# Patient Record
Sex: Female | Born: 1960 | Race: Asian | Hispanic: No | Marital: Married | Smoking: Former smoker
Health system: Southern US, Community
[De-identification: ages and names within clinical notes are randomized; demographics above are authoritative.]

## PROBLEM LIST (undated history)

## (undated) DIAGNOSIS — H7392 Unspecified disorder of tympanic membrane, left ear: Secondary | ICD-10-CM

## (undated) DIAGNOSIS — H7292 Unspecified perforation of tympanic membrane, left ear: Secondary | ICD-10-CM

## (undated) DIAGNOSIS — J029 Acute pharyngitis, unspecified: Secondary | ICD-10-CM

## (undated) HISTORY — DX: Acute pharyngitis, unspecified: J02.9

## (undated) HISTORY — DX: Unspecified disorder of tympanic membrane, left ear: H73.92

## (undated) HISTORY — PX: TONSILLECTOMY: SUR1361

## (undated) HISTORY — DX: Unspecified perforation of tympanic membrane, left ear: H72.92

---

## 2011-11-09 ENCOUNTER — Other Ambulatory Visit: Payer: Self-pay | Admitting: Family Medicine

## 2011-11-09 ENCOUNTER — Ambulatory Visit (INDEPENDENT_AMBULATORY_CARE_PROVIDER_SITE_OTHER): Payer: Self-pay | Admitting: Family Medicine

## 2011-11-09 ENCOUNTER — Encounter: Payer: Self-pay | Admitting: Family Medicine

## 2011-11-09 VITALS — BP 120/80 | Ht 60.0 in | Wt 104.0 lb

## 2011-11-09 DIAGNOSIS — R1310 Dysphagia, unspecified: Secondary | ICD-10-CM

## 2011-11-09 DIAGNOSIS — H7392 Unspecified disorder of tympanic membrane, left ear: Secondary | ICD-10-CM

## 2011-11-09 DIAGNOSIS — H7390 Unspecified disorder of tympanic membrane, unspecified ear: Secondary | ICD-10-CM

## 2011-11-09 HISTORY — DX: Unspecified disorder of tympanic membrane, left ear: H73.92

## 2011-11-09 MED ORDER — DEXLANSOPRAZOLE 30 MG PO CPDR: 30.0000 mg | DELAYED_RELEASE_CAPSULE | Freq: Every day | ORAL | Status: DC

## 2011-11-09 NOTE — Progress Notes (Signed)
CC: Elizabeth Pruitt is a 51 y.o. female is here for Establish Care and Sore Throat   Subjective: HPI:  Patient presents to establish care and to obtain a second opinion regarding one year of a persistent sore throat accompanied by a constellation of other upper respiratory symptoms including left ear fullness, nasal discharge, sinus pressure, pressure in her upper posterior pharynx, and lesions of the mouth. Her biggest complaint is a sore throat. She's been seen by multiple family physicians and has been to [facility] 2-[facility] times for workup of this. She describes her throat discomfort as a pain with swallowing in the back and lower aspect of her throat. There is no pain with movement of her neck. She denies choking or coordination difficulties we'll swallowing. The pain waxes and wanes on monthly basis but is always present and at its worse it is associated with a thick brown discharge that she's able to bring up by gruntin.  She admits to a sour taste in the back of her mouth when eating spicy foods but she takes over-the-counter antacids for, she's unable to quantify how often this occurs.    She's had a mild cough for the past year but there appears where he goes away completely for matter of weeks. She denies fevers,, nausea, change in voice, shortness of breath, orthopnea, wheezing, bloody nose, hemoptysis, swollen lymph nodes, unintentional weight loss.  Per her report treatments have included her take an oral antibiotic on a monthly basis for the past 12 months, symptoms improved for the first 3 days on antibiotic and then return almost predictably on the fourth or fifth day. She's taking numerous oral antihistamines, the names of which escapes her at the moment, but have never given her any relief. She has used numerous nasal steroid sprays in addition to nasal saline sprays without any improvement. She has had allergy testing and was getting allergy drops for the past months but has not had any improvement. She  has never had her nasal cavity or larynx visualized other than with a bedside otoscope.  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    Review Of Systems Outlined In HPI  History reviewed. No pertinent past medical history.   History reviewed. No pertinent family history.   History  Substance Use Topics  . Smoking status: Former Games developer  . Smokeless tobacco: Not on file  . Alcohol Use: No     Objective: Filed Vitals:   11/09/11 1603  BP: 120/80    General: Alert and Oriented, No Acute Distress HEENT: Pupils equal, round, reactive to light. Conjunctivae clear.  External ears unremarkable, canals clear with right tympanic membrane intact with appropriate landmarks, left tympanic membrane appears perforated and disfigured with poor landmarks and white pearly mass in the posterior superior quadrant.  Middle ear on right appears open without effusion. Pink inferior turbinates with moderate white mucoid discharge.  Moist mucous membranes, pharynx without inflammation however moderate cobblestoning but no oral lesions.  Neck supple without palpable lymphadenopathy nor abnormal masses. Thyroid not palpable Lungs: Clear to auscultation bilaterally, no wheezing/ronchi/rales.  Comfortable work of breathing. Good air movement. Cardiac: Regular rate and rhythm. Normal S1/S2.  No murmurs, rubs, nor gallops.   Mental Status: Mildly anxious Skin: Warm and dry.  Assessment & Plan: Elizabeth Pruitt was seen today for establish care and sore throat.  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 - Dexlansoprazole (DEXILANT) 30 MG capsule; Take 1 capsule (30 mg total) by mouth daily.    Patient unable to give specifics but does sound she's had an  appropriate treatment with antihistamines and nasal steroids however the frequency of her antibiotic use somewhat concerning. Her biggest concern is that she has a fungal infection or a cancerous process involving her throat. I believe she does have postnasal drip however she seems reluctant to accept this as a sole diagnosis. We discussed the benefit of a second opinion from a unit and throat group outside of her current group [facility] and perhaps laryngoscopy if felt warranted by her future ENT group may be of some benefit. In the referral of also asked for evaluation for the pearly white mass in the left middle ear suspicious for possible cholesteatoma. To rule out GERD as causing her sore throat I given her a two-week regimen of dexilant , i've asked her to return in 2 weeks to see if this has helped.  Over 30 minutes spent in face-to-face visit today of which at least 90% was counseling or coordinating care.   Return in about 2 days (around 11/11/2011).

## 2011-11-10 ENCOUNTER — Telehealth: Payer: Self-pay | Admitting: *Deleted

## 2011-11-10 NOTE — Telephone Encounter (Signed)
Pt states she gargled salt water last night for her throat and states her throat still hurts. She states she thinks she has a fungal infection and wants you to send Nystatin or something similar to her pharmacy until she gets in with ENT. Please advise.

## 2011-11-10 NOTE — Telephone Encounter (Signed)
Pt informed

## 2011-11-10 NOTE — Telephone Encounter (Addendum)
I didn't see anything that would suggest a fungal infection on my exam yesterday.  I still think the best plan at this time would be to manage this with the Dexilant reigmen and  symptomatically until she's seen by ENT for the second opinion.  This could include chloraseptic sprays/lozenges, ibuprofen 400-800mg  every 8 hours, and regular use of nasal steroid sprays that I believe she has from her prior provider.

## 2011-11-23 ENCOUNTER — Encounter: Payer: Self-pay | Admitting: Family Medicine

## 2011-11-23 DIAGNOSIS — J029 Acute pharyngitis, unspecified: Secondary | ICD-10-CM | POA: Insufficient documentation

## 2011-11-23 DIAGNOSIS — H7292 Unspecified perforation of tympanic membrane, left ear: Secondary | ICD-10-CM | POA: Insufficient documentation

## 2011-11-23 HISTORY — DX: Acute pharyngitis, unspecified: J02.9

## 2011-11-23 HISTORY — DX: Unspecified perforation of tympanic membrane, left ear: H72.92

## 2012-01-06 ENCOUNTER — Encounter: Payer: Self-pay | Admitting: Family Medicine

## 2012-01-06 ENCOUNTER — Ambulatory Visit (INDEPENDENT_AMBULATORY_CARE_PROVIDER_SITE_OTHER): Payer: Managed Care, Other (non HMO) | Admitting: Family Medicine

## 2012-01-06 ENCOUNTER — Ambulatory Visit (INDEPENDENT_AMBULATORY_CARE_PROVIDER_SITE_OTHER): Payer: Managed Care, Other (non HMO)

## 2012-01-06 VITALS — BP 128/90 | HR 80 | Ht 60.0 in | Wt 106.0 lb

## 2012-01-06 DIAGNOSIS — M542 Cervicalgia: Secondary | ICD-10-CM

## 2012-01-06 DIAGNOSIS — M62838 Other muscle spasm: Secondary | ICD-10-CM

## 2012-01-06 DIAGNOSIS — R209 Unspecified disturbances of skin sensation: Secondary | ICD-10-CM

## 2012-01-06 DIAGNOSIS — R200 Anesthesia of skin: Secondary | ICD-10-CM

## 2012-01-06 DIAGNOSIS — J029 Acute pharyngitis, unspecified: Secondary | ICD-10-CM

## 2012-01-06 MED ORDER — ALPRAZOLAM 0.5 MG PO TABS: 0.5000 mg | ORAL_TABLET | Freq: Two times a day (BID) | ORAL | Status: DC | PRN

## 2012-01-06 MED ORDER — HYDROXYZINE HCL 25 MG PO TABS: 25.0000 mg | ORAL_TABLET | Freq: Every evening | ORAL | Status: DC | PRN

## 2012-01-06 NOTE — Progress Notes (Signed)
CC: Elizabeth Pruitt is a 51 y.o. female is here for neck spasms   Subjective: HPI:  Patient presents with a new concern of neck pain. She describes it as a tightness and a burning in the upper posterior neck that radiates somewhat towards the shoulders. It is worse when pillows or mattress as her touching the back of the neck. It is improved with chiropractic manipulation but only for matter of hours. It is somewhat improved with heat. Muscle relaxers in the past have not been helpful as she had identical discomfort 3 years ago which resolved  after months of physical therapy. Xanax helps the pain tremendously, hydroxyzine helps as well with sleep. Unlike 3 years ago she is also experiencing numbness and weakness in the bilateral hands in the fifth and fourth digit. This numbness and weakness is present all hours of the day but seems to be worse when sleeping. She denies recent trauma, coordination problems, nor motor or sensory disturbances other than above  Patient was seen by ear nose and throat and given Bactroban washes for this. She reports using this for couple days but became to be a hassle so she stopped. She still reports pain with swallowing on the right side that comes and goes. She has days where she is completely pain-free. She has "heavy"nasal discharge which is clear on most days of the week. She's decided that she's not to go back to Dr. Shona Simpson with ENT because she believes it's a hassle. She denies cough, choking, trouble swallowing, facial pain, hearing loss, fevers, chills.  Review Of Systems Outlined In HPI  Past Medical History  Diagnosis Date  . 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 Sore Throat - Dr. Dirk Dress ENT 11/23/2011     No family history on file.   History  Substance Use Topics  . Smoking status: Former Games developer  . Smokeless tobacco: Not on file  . Alcohol Use: No     Objective: Filed  Vitals:   01/06/12 0955  BP: 128/90  Pulse: 80    General: Alert and Oriented, No Acute Distress HEENT: Pupils equal, round, reactive to light. Conjunctivae clear.   Moist mucous membranes, pharynx without inflammation nor lesions.  Neck supple without palpable lymphadenopathy nor abnormal masses. Lungs: Clear to auscultation bilaterally, no wheezing/ronchi/rales.  Comfortable work of breathing. Good air movement. Cardiac: Regular rate and rhythm. Normal S1/S2.  No murmurs, rubs, nor gallops.   Extremities: No peripheral edema.  Strong peripheral pulses. C5-C6 DTRs two over four bilaterally. Normal grip strength, normal wrist flexion and extension, normal elbow flexion and extension. No sign of muscle atrophy in the hands. No swollen or redness of the joints in the hands. Decreased sensation to light touch in the fifth digit bilaterally on the palmar and dorsal aspects. Neck: Midline tenderness of the C6 with palpation, hypertonic superior trapezius bilaterally with reproduction of her discomfort with palpation of the paraspinal posterior cervical musculature Spurling's negative Mental Status: No depression, anxiety, nor agitation. Skin: Warm and dry.  Assessment & Plan: Elizabeth Pruitt was seen today for neck spasms.  Diagnoses and associated orders for this visit:  Neck pain - Nerve conduction test; Future - DG Cervical Spine Complete; Future  Bilateral hand numbness - Nerve conduction test; Future  Muscle spasm - ALPRAZolam (XANAX) 0.5 MG tablet; Take 1 tablet (0.5 mg total) by mouth 2 (two) times daily as needed.  Sore throat - dr. Olegario Messier [facility] ent  Other Orders - Discontinue: HYDROXYZINE HCL PO; Take by mouth. - hydrOXYzine (ATARAX/VISTARIL) 25 MG tablet; Take 1 tablet (25 mg total) by mouth at bedtime as needed.    Sore throat: Unchanged I advised her to followup with her ENT as last progress note mentioned a CT scan of the sinuses would be the next if not improving. Neck pain: I  believe there is a component of muscle spasms  contributing to her pain, it sounds as muscle relaxers been ineffective in the past, she may continue Xanax on an as-needed basis. Cervical films to look for arthritis or bone spurring. I did nerve conduction studies due to her subjective sensory loss. Patient was given handout on range of motion in cervical musculature stretching techniques.   Return in about 2 weeks (around 01/20/2012).

## 2012-03-09 ENCOUNTER — Ambulatory Visit: Payer: Managed Care, Other (non HMO) | Admitting: Family Medicine

## 2012-03-23 ENCOUNTER — Ambulatory Visit (INDEPENDENT_AMBULATORY_CARE_PROVIDER_SITE_OTHER): Payer: Managed Care, Other (non HMO) | Admitting: Family Medicine

## 2012-03-23 ENCOUNTER — Encounter: Payer: Self-pay | Admitting: Family Medicine

## 2012-03-23 VITALS — BP 123/82 | HR 71 | Wt 111.0 lb

## 2012-03-23 DIAGNOSIS — A499 Bacterial infection, unspecified: Secondary | ICD-10-CM

## 2012-03-23 DIAGNOSIS — J329 Chronic sinusitis, unspecified: Secondary | ICD-10-CM

## 2012-03-23 MED ORDER — PREDNISONE 20 MG PO TABS: ORAL_TABLET | ORAL | Status: AC

## 2012-03-23 MED ORDER — AMOXICILLIN-POT CLAVULANATE 500-125 MG PO TABS: ORAL_TABLET | ORAL | Status: AC

## 2012-03-23 NOTE — Progress Notes (Signed)
CC: Elizabeth Pruitt is a 51 y.o. female is here for Sinusitis   Subjective: HPI:  Patient complains of facial pressure and nasal congestion. This is been present for about a month and has worsening on a weekly basis. Pressure is described as pain and moderate to severe in severity which is worsened with leaning forward or lying down. Nasal congestion is green and present 24 hours a day but does not interfere with sleep. Symptoms have slightly improved with nasal saline, Mucinex, Aleve. She has fatigue. She denies fevers, chills, motor sensory disturbances, double vision, ocular complaints, ear complaints, headache other than that described above. She denies neck pain, dysphagia, hearing loss, rashes, shortness of breath, nor chest pain.   Review Of Systems Outlined In HPI  Past Medical History  Diagnosis Date  . 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 Sore Throat - Dr. Dirk Dress ENT 11/23/2011     No family history on file.   History  Substance Use Topics  . Smoking status: Former Games developer  . Smokeless tobacco: Not on file  . Alcohol Use: No     Objective: Filed Vitals:   03/23/12 1316  BP: 123/82  Pulse: 71    General: Alert and Oriented, No Acute Distress HEENT: Pupils equal, round, reactive to light. Conjunctivae clear.  External ears unremarkable, canals clear with intact TM on the right but a chronic perforated TM on the left..  Middle ear appears open without effusion. Boggy and erythematous inferior turbinates with moderate mucoid discharge.  Moist mucous membranes, pharynx without inflammation nor lesions.  Neck supple without palpable lymphadenopathy nor abnormal masses. Frontal sinus tenderness to percussion Lungs: Clear to auscultation bilaterally, no wheezing/ronchi/rales.  Comfortable work of breathing. Good air movement. Cardiac: Regular rate and rhythm. Normal S1/S2.  No murmurs, rubs, nor gallops.    Extremities: No peripheral edema.  Strong peripheral pulses.  Mental Status: No depression, anxiety, nor agitation. Skin: Warm and dry.  Assessment & Plan: Elizabeth Pruitt was seen today for sinusitis.  Diagnoses and associated orders for this visit:  Bacterial sinusitis - amoxicillin-clavulanate (AUGMENTIN) 500-125 MG per tablet; Take one by mouth every 8 hours for ten total days. - predniSONE (DELTASONE) 20 MG tablet; Two tabs at once daily for five days.    Start Augmentin, continue nasal saline washes, Mucinex. Given severity of discomfort will start prednisone moderate dose for 5 days.Signs and symptoms requring emergent/urgent reevaluation were discussed with the patient.  Return if symptoms worsen or fail to improve.

## 2013-01-01 ENCOUNTER — Encounter: Payer: Self-pay | Admitting: Emergency Medicine

## 2013-01-01 ENCOUNTER — Emergency Department
Admission: EM | Admit: 2013-01-01 | Discharge: 2013-01-01 | Disposition: A | Discharge status: Home / Self Care | Payer: Managed Care, Other (non HMO) | Source: Home / Self Care | Attending: Emergency Medicine | Admitting: Emergency Medicine

## 2013-01-01 DIAGNOSIS — R300 Dysuria: Secondary | ICD-10-CM

## 2013-01-01 DIAGNOSIS — N390 Urinary tract infection, site not specified: Secondary | ICD-10-CM

## 2013-01-01 DIAGNOSIS — J209 Acute bronchitis, unspecified: Secondary | ICD-10-CM

## 2013-01-01 LAB — POCT URINALYSIS DIP (MANUAL ENTRY)
Bilirubin, UA: NEGATIVE
Glucose, UA: NEGATIVE
Ketones, POC UA: NEGATIVE
Nitrite, UA: POSITIVE
Spec Grav, UA: 1.015 (ref 1.005–1.03)
Urobilinogen, UA: 0.2 (ref 0–1)
pH, UA: 7 (ref 5–8)

## 2013-01-01 MED ORDER — PROMETHAZINE-CODEINE 6.25-10 MG/5ML PO SYRP: ORAL_SOLUTION | ORAL | Status: DC

## 2013-01-01 MED ORDER — LEVOFLOXACIN 500 MG PO TABS: ORAL_TABLET | ORAL | Status: DC

## 2013-01-01 NOTE — ED Notes (Signed)
Elizabeth Pruitt c/o dysuria and hematuria x 2 days. Taken Azo today. Productive cough and congestion with CP x 4 days. C/o chills, denies fever.

## 2013-01-01 NOTE — ED Provider Notes (Addendum)
CSN: [id number]     Arrival date & time 01/01/13  1558 History   First MD Initiated Contact with Patient 01/01/13 1601     Chief Complaint  Patient presents with  . Dysuria  . Cough   (Consider location/radiation/quality/duration/timing/severity/associated sxs/prior Treatment) HPI URI HISTORY  Elizabeth Pruitt is a 52 y.o. female who complains of onset of cold symptoms for 4 days.  Have been using over-the-counter treatment which helps a little bit.  No chills/sweats +  Low-grade Fever  +  Nasal congestion +  Discolored Post-nasal drainage No sinus pain/pressure No sore throat  +  Cough, especially at night No wheezing Positive, mild chest congestion No hemoptysis No shortness of breath No pleuritic pain  No itchy/red eyes No earache  No nausea No vomiting No abdominal pain No diarrhea  No skin rashes +  Fatigue No myalgias No headache  UTI HISTORY  This is a 52 y.o. female who presents today with UTI symptoms for 1 day.  + dysuria + frequency + urgency No hematuria No vaginal discharge No fever/chills No lower abdominal pain No nausea No vomiting No back pain Positive fatigue She denies chance of pregnancy. Has tried over-the-counter measures without improvement.   Past Medical History  Diagnosis Date  . 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 Sore Throat - Dr. Dirk Dress ENT 11/23/2011   Past Surgical History  Procedure Laterality Date  . Cesarean section     History reviewed. No pertinent family history. History  Substance Use Topics  . Smoking status: Former Games developer  . Smokeless tobacco: Never Used  . Alcohol Use: No   OB History   Grav Para Term Preterm Abortions TAB SAB Ect Mult Living                 Review of Systems  All other systems reviewed and are negative.    Allergies  Sulfa drugs cross reactors  Home Medications   Current Outpatient Rx  Name  Route  Sig   Dispense  Refill  . levofloxacin (LEVAQUIN) 500 MG tablet      Take 1 tablet daily for 10 days.--- This is an antibiotic for both bronchitis and urinary tract infection   10 tablet   0   . promethazine-codeine (PHENERGAN WITH CODEINE) 6.25-10 MG/5ML syrup      Take 1-2 teaspoons at bedtime as needed for severe cough. May cause drowsiness.   120 mL   0    BP 142/94  Pulse 98  Temp(Src) 98.4 F (36.9 C) (Oral)  Resp 14  Ht 5' (1.524 m)  Wt 113 lb (51.256 kg)  BMI 22.07 kg/m2  SpO2 97% Physical Exam  Nursing note and vitals reviewed. Constitutional: She is oriented to person, place, and time. She appears well-developed and well-nourished. No distress.  HENT:  Head: Normocephalic and atraumatic.  Right Ear: Tympanic membrane normal.  Left Ear: Tympanic membrane normal.  Nose: Mucosal edema and rhinorrhea (discolored) present. Right sinus exhibits maxillary sinus tenderness. Right sinus exhibits no frontal sinus tenderness. Left sinus exhibits maxillary sinus tenderness. Left sinus exhibits no frontal sinus tenderness.  Mouth/Throat: Oropharynx is clear and moist. No oropharyngeal exudate.  Eyes: Right eye exhibits no discharge. Left eye exhibits no discharge. No scleral icterus.  Neck: Neck supple.  Cardiovascular: Normal rate, regular rhythm and normal heart sounds.   Pulmonary/Chest: No respiratory distress. She has no wheezes. She has rhonchi. She has no rales.  Abdominal: Soft. She exhibits no distension and no mass. There is tenderness (minimal, suprapubic). There is no rebound and no guarding.  No flank or CVA tenderness  Lymphadenopathy:    She has no cervical adenopathy.  Neurological: She is alert and oriented to person, place, and time.  Skin: Skin is warm and dry.    ED Course  Procedures (including critical care time) Labs Review Labs Reviewed  URINE CULTURE  POCT URINALYSIS DIP (MANUAL ENTRY)   Imaging Review No results found.  EKG Interpretation     Date/Time:    Ventricular Rate:    PR Interval:    QRS Duration:   QT Interval:    QTC Calculation:   R Axis:     Text Interpretation:             Urinalysis: Positive for blood, leukocytes and nitrate MDM   1. Acute bronchitis   2. Dysuria   3. Infection of urinary tract    We discussed treatment options. After risks, benefits, alternatives discussed, she agrees with the following plans: Levaquin 500 mg daily x10 days. This will cover the acute maxillary sinusitis and acute bronchitis.--This will also cover her UTI Urine culture sent Small prescription for Phenergan with codeine cough syrup when necessary cough at nighttime Other symptomatic care discussed Followup with PCP one week, sooner if worse or new symptoms Precautions discussed. Red flags discussed. Questions invited and answered. Patient voiced understanding and agreement.    Elizabeth Manes, MD 01/01/13 1747  Elizabeth Manes, MD 01/01/13 [phone]

## 2013-01-02 LAB — URINE CULTURE

## 2013-07-23 ENCOUNTER — Encounter: Payer: Self-pay | Admitting: Emergency Medicine

## 2013-07-23 ENCOUNTER — Emergency Department
Admission: EM | Admit: 2013-07-23 | Discharge: 2013-07-23 | Disposition: A | Discharge status: Home / Self Care | Payer: Managed Care, Other (non HMO) | Source: Home / Self Care | Attending: Emergency Medicine | Admitting: Emergency Medicine

## 2013-07-23 DIAGNOSIS — H6692 Otitis media, unspecified, left ear: Secondary | ICD-10-CM

## 2013-07-23 DIAGNOSIS — H6690 Otitis media, unspecified, unspecified ear: Secondary | ICD-10-CM

## 2013-07-23 MED ORDER — AMOXICILLIN-POT CLAVULANATE 875-125 MG PO TABS: 1.0000 | ORAL_TABLET | Freq: Two times a day (BID) | ORAL | Status: DC

## 2013-07-23 NOTE — ED Notes (Signed)
  Cold sx started approx 2 1/2 weeks ago.  Now has PND  With pain in ears L>R.

## 2013-07-23 NOTE — ED Provider Notes (Signed)
CSN: [id number]     Arrival date & time 07/23/13  1534 History   First MD Initiated Contact with Patient 07/23/13 1611     Chief Complaint  Patient presents with  . Otalgia   (Consider location/radiation/quality/duration/timing/severity/associated sxs/prior Treatment) Patient is a 53 y.o. female presenting with ear pain. The history is provided by the patient.  Otalgia  Cold sx started approx 2 1/2 weeks ago. Now has PND With pain in ears L>R.  URI HISTORY  Elizabeth Pruitt is a 53 y.o. female who complains of onset of cold symptoms for several days.  Have been using over-the-counter treatment which helps a little bit.  No chills/sweats +  Fever  +  Nasal congestion +  Discolored Post-nasal drainage + L sinus pain/pressure + mild sore throat  No cough No wheezing No chest congestion No hemoptysis No shortness of breath No pleuritic pain  No itchy/red eyes + L earache  No nausea No vomiting No abdominal pain No diarrhea  No skin rashes +  Fatigue No myalgias No headache    Prior history of ear infections in the past. Status post tonsillectomy in the past Past Medical History  Diagnosis Date  . 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 Sore Throat - Dr. Dirk DressKathy Teasdall ENT 11/23/2011   Past Surgical History  Procedure Laterality Date  . Cesarean section    . Tonsillectomy     History reviewed. No pertinent family history. History  Substance Use Topics  . Smoking status: Former Games developermoker  . Smokeless tobacco: Never Used  . Alcohol Use: No   OB History   Grav Para Term Preterm Abortions TAB SAB Ect Mult Living                 Review of Systems  HENT: Positive for ear pain.   All other systems reviewed and are negative.   Allergies  Sulfa antibiotics and Sulfa drugs cross reactors  Home Medications   Prior to Admission medications   Medication Sig Start Date End Date Taking? Authorizing Provider   amoxicillin-clavulanate (AUGMENTIN) 875-125 MG per tablet Take 1 tablet by mouth 2 (two) times daily. For 10 days. Take with food. 07/23/13   Lajean Manesavid Massey, MD   BP 118/83  Pulse 81  Temp(Src) 97.7 F (36.5 C) (Oral)  Ht 5' (1.524 m)  Wt 106 lb 6.4 oz (48.263 kg)  BMI 20.78 kg/m2  SpO2 98% Physical Exam  Nursing note and vitals reviewed. Constitutional: She is oriented to person, place, and time. She appears well-developed and well-nourished. No distress.  HENT:  Head: Normocephalic and atraumatic.  Right Ear: Tympanic membrane, external ear and ear canal normal.  Left Ear: External ear and ear canal normal. Tympanic membrane is injected and erythematous.  Nose: Mucosal edema and rhinorrhea present.  Mouth/Throat: Oropharynx is clear and moist. No oral lesions.  Eyes: Conjunctivae are normal. No scleral icterus.  Neck: Neck supple.  Cardiovascular: Normal rate, regular rhythm and normal heart sounds.   Pulmonary/Chest: Effort normal and breath sounds normal.  Lymphadenopathy:    She has no cervical adenopathy.  Neurological: She is alert and oriented to person, place, and time.  Skin: Skin is warm and dry.    ED Course  Procedures (including critical care time) Labs Review Labs Reviewed - No data to display  Imaging Review No results found.   MDM   1. Acute left otitis media    Treatment options discussed,  as well as risks, benefits, alternatives. Patient voiced understanding and agreement with the following plans:  Augmentin 875 twice a day x10 days Mucinex D. twice a day Push fluids and other symptomatic care discussed. Follow-up with your primary care doctor in 5-7 days if not improving, or sooner if symptoms become worse. Precautions discussed. Red flags discussed. Questions invited and answered. Patient voiced understanding and agreement.     Lajean Manes, MD 07/23/13 [phone]

## 2013-07-24 ENCOUNTER — Ambulatory Visit: Payer: Managed Care, Other (non HMO) | Admitting: Family Medicine

## 2013-07-26 ENCOUNTER — Encounter: Payer: Self-pay | Admitting: Family Medicine

## 2013-07-26 ENCOUNTER — Ambulatory Visit (INDEPENDENT_AMBULATORY_CARE_PROVIDER_SITE_OTHER): Payer: Managed Care, Other (non HMO) | Admitting: Family Medicine

## 2013-07-26 VITALS — BP 127/89 | HR 90 | Wt 109.0 lb

## 2013-07-26 DIAGNOSIS — R5383 Other fatigue: Secondary | ICD-10-CM

## 2013-07-26 DIAGNOSIS — R5381 Other malaise: Secondary | ICD-10-CM

## 2013-07-26 DIAGNOSIS — L820 Inflamed seborrheic keratosis: Secondary | ICD-10-CM

## 2013-07-26 DIAGNOSIS — R232 Flushing: Secondary | ICD-10-CM

## 2013-07-26 NOTE — Addendum Note (Signed)
Addended by: Laren BoomHOMMEL, [place] on: 07/26/2013 12:07 PM   Modules accepted: Level of Service

## 2013-07-26 NOTE — Progress Notes (Signed)
CC: Elizabeth Pruitt is a 53 y.o. female is here for pt is having chills and freeze SK   Subjective: HPI:  Patient complains of severe flushing, chills, feelings of overwhelming warmth that fluctuates throughout the day and worsened by sudden changes in temperature. Symptoms have been present for 2-3 years and a source of her issues has not been found. She tells me she has been checked for abnormal thyroid function over a year ago and this was "mildly abnormal"she's unsure of specifics beyond that.  No interventions other than above. She forgets the date of her last menstrual period it has been well over a year since she's had any vaginal bleeding. She's uncertain whether or not symptoms began around the time of menopause.  Denies trouble fighting infections, swollen lymph nodes, unintentional weight loss, gastrointestinal disturbance, nor muscle or joint pain. Review of systems is positive for fatigue is also present on a daily basis and worse when the above symptoms are at their worst. Nothing particularly makes it better.  She complains of a growth on her left face overlying the left cheek that has been present for years that is slowly growing. She describes daily pain that is worse after applying makeup or when washing her face. Pain has been worsening on yearly basis now mild to moderate severity. No interventions as of yet she denies pigmented lesions elsewhere, personal or family history of skin cancer.   Review Of Systems Outlined In HPI  Past Medical History  Diagnosis Date  . 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 Sore Throat - Dr. Dirk DressKathy Teasdall ENT 11/23/2011    Past Surgical History  Procedure Laterality Date  . Cesarean section    . Tonsillectomy     No family history on file.  History   Social History  . Marital Status: Married    Spouse Name: N/A    Number of Children: N/A  . Years of Education: N/A    Occupational History  . Not on file.   Social History Main Topics  . Smoking status: Former Games developermoker  . Smokeless tobacco: Never Used  . Alcohol Use: No  . Drug Use: No  . Sexual Activity: Yes   Other Topics Concern  . Not on file   Social History Narrative  . No narrative on file     Objective: BP 127/89  Pulse 90  Wt 109 lb (49.442 kg)  General: Alert and Oriented, No Acute Distress HEENT: Pupils equal, round, reactive to light. Conjunctivae clear.  Moist membranes pharynx unremarkable. Neck is without any appreciable thyromegaly Lungs: Clear to auscultation bilaterally, no wheezing/ronchi/rales.  Comfortable work of breathing. Good air movement. Cardiac: Regular rate and rhythm. Normal S1/S2.  No murmurs, rubs, nor gallops.   Extremities: No peripheral edema.  Strong peripheral pulses.  Mental Status: No depression, anxiety, nor agitation. Skin: Warm and dry. On her face overlying the left cheek there is a 3 mm diameter seborrheic keratosis which is tender to the touch  Assessment & Plan: Elizabeth Pruitt was seen today for pt is having chills and freeze sk.  Diagnoses and associated orders for this visit:  Vasomotor flushing - TSH - T4, free - T3, free - FSH  Fatigue - TSH - T4, free - T3, free - CBC - Vit D  25 hydroxy (rtn osteoporosis monitoring)  Seborrheic keratoses, inflamed    Vasomotor flushing with fatigue: Rule out thyroid abnormality, my high suspicion is that  this is due to postmenopausal vasomotor flushing. Thyroid studies are normal will offer hormone replacement therapy versus SSRI/SNRI therapy. Fatigue: Rule out anemia, hypothyroidism, vitamin D deficiency Seborrheic keratosis: She would prefer to have this lesion destructed due to persistent pain which was performed with cryotherapy today Cryotherapy Procedure Note  Pre-operative Diagnosis: Inflamed seborrheic keratosis  Post-operative Diagnosis: Same  Locations: Left cheek of the  face  Indications: Persistent pain  Anesthesia: None  Procedure Details  History of allergy to iodine: no. Pacemaker? no.  Patient informed of risks (permanent scarring, infection, light or dark discoloration, bleeding, infection, weakness, numbness and recurrence of the lesion) and benefits of the procedure and verbal informed consent obtained.  The areas are treated with liquid nitrogen therapy, frozen until ice ball extended 2 mm beyond lesion, allowed to thaw, and treated again. The patient tolerated procedure well.  The patient was instructed on post-op care, warned that there may be blister formation, redness and pain. Recommend OTC analgesia as needed for pain.  Condition: Stable  Complications: none.  Plan: 1. Instructed to keep the area dry and covered for 24-48h and clean thereafter. 2. Warning signs of infection were reviewed.   3. Recommended that the patient use OTC analgesics as needed for pain.  4. Return in 1 week if lesion is not scabbing and beginning to fall off.    Return if symptoms worsen or fail to improve.

## 2013-07-27 LAB — CBC
HCT: 38.9 % (ref 36.0–46.0)
Hemoglobin: 12.9 g/dL (ref 12.0–15.0)
MCH: 30.2 pg (ref 26.0–34.0)
MCHC: 33.2 g/dL (ref 30.0–36.0)
MCV: 91.1 fL (ref 78.0–100.0)
Platelets: 251 K/uL (ref 150–400)
RBC: 4.27 MIL/uL (ref 3.87–5.11)
RDW: 13.2 % (ref 11.5–15.5)
WBC: 4.8 K/uL (ref 4.0–10.5)

## 2013-07-27 LAB — T3, FREE: T3, Free: 3.2 pg/mL (ref 2.3–4.2)

## 2013-07-27 LAB — VITAMIN D 25 HYDROXY (VIT D DEFICIENCY, FRACTURES): Vit D, 25-Hydroxy: 39 ng/mL (ref 30–89)

## 2013-07-27 LAB — FOLLICLE STIMULATING HORMONE: FSH: 146.5 m[IU]/mL — AB

## 2013-07-27 LAB — TSH: TSH: 1.673 u[IU]/mL (ref 0.350–4.500)

## 2013-07-27 LAB — T4, FREE: Free T4: 1.39 ng/dL (ref 0.80–1.80)

## 2013-07-30 ENCOUNTER — Telehealth: Payer: Self-pay | Admitting: Family Medicine

## 2013-07-30 DIAGNOSIS — R232 Flushing: Secondary | ICD-10-CM | POA: Insufficient documentation

## 2013-07-30 MED ORDER — CONJ ESTROG-MEDROXYPROGEST ACE 0.3-1.5 MG PO TABS: 1.0000 | ORAL_TABLET | Freq: Every day | ORAL | Status: DC

## 2013-07-30 NOTE — Telephone Encounter (Signed)
Pt.notified

## 2013-07-30 NOTE — Telephone Encounter (Signed)
Hormone replacement therapy sent to [facility] on file.  F/U with me in the next 2-4 weeks or sooner for any concerns.

## 2013-08-09 ENCOUNTER — Encounter: Payer: Self-pay | Admitting: Family Medicine

## 2013-08-09 ENCOUNTER — Ambulatory Visit (INDEPENDENT_AMBULATORY_CARE_PROVIDER_SITE_OTHER): Payer: Managed Care, Other (non HMO) | Admitting: Family Medicine

## 2013-08-09 VITALS — BP 115/88 | HR 65 | Wt 106.0 lb

## 2013-08-09 DIAGNOSIS — R232 Flushing: Secondary | ICD-10-CM

## 2013-08-09 DIAGNOSIS — R55 Syncope and collapse: Secondary | ICD-10-CM

## 2013-08-09 DIAGNOSIS — L820 Inflamed seborrheic keratosis: Secondary | ICD-10-CM

## 2013-08-09 MED ORDER — VENLAFAXINE HCL ER 75 MG PO CP24: 75.0000 mg | ORAL_CAPSULE | Freq: Every day | ORAL | Status: DC

## 2013-08-09 NOTE — Progress Notes (Signed)
CC: Elizabeth Pruitt is a 53 y.o. female is here for Seborrheic Keratosis   Subjective: HPI:  Presents for followup of vasomotor flushing. She states that symptoms improved approximately 50% while on Prempro. Unfortunately within days after starting this she had a swelling sensation in her hands, nighttime nervousness, both of which were severe in severity and intolerable. She cut her dose in half about 2 weeks ago and initial symptoms remain improved, side effects became moderate in severity over still intolerable. She stopped her medication last week and since then has had complete resolution of the side effects but return of severe hot flashes and chills. Symptoms are chronic daily basis worsened by 7 temperature swings. She denies unintentional weight loss or gain, objective fever, cough, shortness of breath, abdominal pain.  Reports that the seborrheic keratosis that was treated with cryotherapy last month scabbed over and has reduced in size by about 50%. It is still painful to the touch similar to symptoms that were prior to prior therapy however barely improved   Review Of Systems Outlined In HPI  Past Medical History  Diagnosis Date  . 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 Sore Throat - Dr. Dirk DressKathy Teasdall ENT 11/23/2011    Past Surgical History  Procedure Laterality Date  . Cesarean section    . Tonsillectomy     No family history on file.  History   Social History  . Marital Status: Married    Spouse Name: N/A    Number of Children: N/A  . Years of Education: N/A   Occupational History  . Not on file.   Social History Main Topics  . Smoking status: Former Games developermoker  . Smokeless tobacco: Never Used  . Alcohol Use: No  . Drug Use: No  . Sexual Activity: Yes   Other Topics Concern  . Not on file   Social History Narrative  . No narrative on file     Objective: BP 115/88  Pulse 65  Wt 106 lb (48.081  kg)  General: Alert and Oriented, No Acute Distress HEENT: Pupils equal, round, reactive to light. Conjunctivae clear.  Moist membranes pharynx unremarkable Lungs: Clear to auscultation bilaterally, no wheezing/ronchi/rales.  Comfortable work of breathing. Good air movement. Cardiac: Regular rate and rhythm. Normal S1/S2.  No murmurs, rubs, nor gallops.   Extremities: No peripheral edema.  Strong peripheral pulses.  Mental Status: No depression, anxiety, nor agitation. Skin: Warm and dry. 2 mm diameter inflamed and tender seborrheic keratosis on the left cheek below the left eye  Assessment & Plan: Elizabeth Pruitt was seen today for seborrheic keratosis.  Diagnoses and associated orders for this visit:  Vasomotor instability - venlafaxine XR (EFFEXOR XR) 75 MG 24 hr capsule; Take 1 capsule (75 mg total) by mouth daily.  Vasomotor flushing  Seborrheic keratoses, inflamed    Vasomotor instability: Uncontrolled stop estrogen start low-dose Effexor. Seborrheic keratosis: Repeat cryotherapy was performed, she was not billed for this procedure given how recent the initial procedure was done  Cryotherapy Procedure Note  Pre-operative Diagnosis: inflammed SK  Post-operative Diagnosis: same  Locations: left facial cheek  Indications: pain  Anesthesia: not required none  Procedure Details  History of allergy to iodine: no. Pacemaker? no.  Patient informed of risks (permanent scarring, infection, light or dark discoloration, bleeding, infection, weakness, numbness and recurrence of the lesion) and benefits of the procedure and verbal informed consent obtained.  The areas are treated with liquid  nitrogen therapy, frozen until ice ball extended 2 mm beyond lesion, allowed to thaw, and treated again. The patient tolerated procedure well.  The patient was instructed on post-op care, warned that there may be blister formation, redness and pain. Recommend OTC analgesia as needed for  pain.  Condition: Stable  Complications: none.  Plan: 1. Instructed to keep the area dry and covered for 24-48h and clean thereafter. 2. Warning signs of infection were reviewed.   3. Recommended that the patient use OTC analgesics as needed for pain.  4. Return PRN   Return if symptoms worsen or fail to improve.

## 2014-07-30 ENCOUNTER — Telehealth: Payer: Self-pay | Admitting: *Deleted

## 2014-07-30 NOTE — Telephone Encounter (Signed)
Jocelyn, pt's daughter called and states her mother is feeling worse and wanted to discuss her concerns.Marland KitchenMarland KitchenI called the daughter and left a message on vm that it would be best for pt to schedule an appt since I am not sure what sxs she is having and she has not been in about a year.An appt would be the best way to discuss any concerns

## 2014-11-24 ENCOUNTER — Ambulatory Visit (INDEPENDENT_AMBULATORY_CARE_PROVIDER_SITE_OTHER): Payer: Managed Care, Other (non HMO)

## 2014-11-24 ENCOUNTER — Encounter: Payer: Self-pay | Admitting: Family Medicine

## 2014-11-24 ENCOUNTER — Ambulatory Visit (INDEPENDENT_AMBULATORY_CARE_PROVIDER_SITE_OTHER): Payer: Managed Care, Other (non HMO) | Admitting: Family Medicine

## 2014-11-24 VITALS — BP 127/89 | HR 79 | Wt 109.0 lb

## 2014-11-24 DIAGNOSIS — M6740 Ganglion, unspecified site: Secondary | ICD-10-CM | POA: Insufficient documentation

## 2014-11-24 DIAGNOSIS — M67431 Ganglion, right wrist: Secondary | ICD-10-CM

## 2014-11-24 DIAGNOSIS — F411 Generalized anxiety disorder: Secondary | ICD-10-CM | POA: Diagnosis not present

## 2014-11-24 MED ORDER — SERTRALINE HCL 25 MG PO TABS: 25.0000 mg | ORAL_TABLET | Freq: Every day | ORAL | Status: DC

## 2014-11-24 NOTE — Assessment & Plan Note (Signed)
Anxiety likely related to generalized anxiety disorder. Discussed options. Patient declined counseling which I feel is her best option. I'm very reluctant to start benzodiazepine as for this problem. Start low-dose Zoloft return in 2 weeks.

## 2014-11-24 NOTE — Assessment & Plan Note (Signed)
Calcified Ganglion cyst likely cause of patient's nodule. X-ray pending likely referral to hand surgery. Patient is not interested in aspiration or injection.

## 2014-11-24 NOTE — Progress Notes (Signed)
Elizabeth Pruitt is a 54 y.o. female who presents to [facility]  today for right wrist nodule and anxiety.Marland Kitchen  1) right dorsal ulnar wrist nodule present for 10 years. Initially was squishy now it is formed. Nontender. No fevers chills nausea vomiting or diarrhea. No no treatment tried recently. 10 years ago patient had some sort of excisional procedure attempt to remove the nodule however returned.  2) anxiety: Present intermittently for years. Patient is using X in the past which works well. She states that she's had increasing life stressors recently leading to worsening anxiety. She denies any panic attacks or agoraphobia.   Past Medical History  Diagnosis Date  . 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 Sore Throat - Dr. Dirk Dress ENT 11/23/2011   Past Surgical History  Procedure Laterality Date  . Cesarean section    . Tonsillectomy     Social History  Substance Use Topics  . Smoking status: Former Games developer  . Smokeless tobacco: Never Used  . Alcohol Use: No   family history is not on file.  ROS as above Medications: Current Outpatient Prescriptions  Medication Sig Dispense Refill  . sertraline (ZOLOFT) 25 MG tablet Take 1 tablet (25 mg total) by mouth daily. Take 1 pill daily for 1 week then increase to 2 pills daily after week 1 30 tablet 0   No current facility-administered medications for this visit.   Allergies  Allergen Reactions  . Sulfa Antibiotics   . Sulfa Drugs Cross Reactors Hives    Red splotches all over body     Exam:  BP 127/89 mmHg  Pulse 79  Wt 109 lb (49.442 kg) Gen: Well NAD HEENT: EOMI,  MMM Lungs: Normal work of breathing. CTABL Heart: RRR no MRG Abd: NABS, Soft. Nondistended, Nontender Exts: Brisk capillary refill, warm and well perfused.  Right wrist: Normal-appearing with small dorsal mildly tender nodule right dorsal ulnar wrist. Normal  wrist motion. Firm to touch without fluctuance. Psych: Alert and oriented normal affect speech and thought process. GAD 7 is 11.  No results found for this or any previous visit (from the past 24 hour(s)). No results found.   Please see individual assessment and plan sections.

## 2014-11-24 NOTE — Patient Instructions (Signed)
Thank you for coming in today. Get xray today.  Start zoloft.  Follow up with Dr. Ivan Anchors in 2 weeks.   Ganglion Cyst A ganglion cyst is a noncancerous, fluid-filled lump that occurs near joints or tendons. The ganglion cyst grows out of a joint or the lining of a tendon. It most often develops in the hand or wrist, but it can also develop in the shoulder, elbow, hip, knee, ankle, or foot. The round or oval ganglion cyst can be the size of a pea or larger than a grape. Increased activity may enlarge the size of the cyst because more fluid starts to build up.  CAUSES It is not known what causes a ganglion cyst to grow. However, it may be related to:  Inflammation or irritation around the joint.  An injury.  Repetitive movements or overuse.  Arthritis. RISK FACTORS Risk factors include:  Being a woman.  Being age 50-50. SIGNS AND SYMPTOMS Symptoms may include:   A lump. This most often appears on the hand or wrist, but it can occur in other areas of the body.  Tingling.  Pain.  Numbness.  Muscle weakness.  Weak grip.  Less movement in a joint. DIAGNOSIS Ganglion cysts are most often diagnosed based on a physical exam. Your health care provider will feel the lump and may shine a light alongside it. If it is a ganglion cyst, a light often shines through it. Your health care provider may order an X-ray, ultrasound, or MRI to rule out other conditions. TREATMENT Ganglion cysts usually go away on their own without treatment. If pain or other symptoms are involved, treatment may be needed. Treatment is also needed if the ganglion cyst limits your movement or if it gets infected. Treatment may include:  Wearing a brace or splint on your wrist or finger.  Taking anti-inflammatory medicine.  Draining fluid from the lump with a needle (aspiration).  Injecting a steroid into the joint.  Surgery to remove the ganglion cyst. HOME CARE INSTRUCTIONS  Do not press on the ganglion  cyst, poke it with a needle, or hit it.  Take medicines only as directed by your health care provider.  Wear your brace or splint as directed by your health care provider.  Watch your ganglion cyst for any changes.  Keep all follow-up visits as directed by your health care provider. This is important. SEEK MEDICAL CARE IF:  Your ganglion cyst becomes larger or more painful.  You have increased redness, red streaks, or swelling.  You have pus coming from the lump.  You have weakness or numbness in the affected area.  You have a fever or chills.   This information is not intended to replace advice given to you by your health care provider. Make sure you discuss any questions you have with your health care provider.   Document Released: 01/29/2000 Document Revised: 02/21/2014 Document Reviewed: 07/16/2013 Elsevier Interactive Patient Education 2016 Elsevier Inc.   Generalized Anxiety Disorder Generalized anxiety disorder (GAD) is a mental disorder. It interferes with life functions, including relationships, work, and school. GAD is different from normal anxiety, which everyone experiences at some point in their lives in response to specific life events and activities. Normal anxiety actually helps [place] prepare for and get through these life events and activities. Normal anxiety goes away after the event or activity is over.  GAD causes anxiety that is not necessarily related to specific events or activities. It also causes excess anxiety in proportion to specific events  or activities. The anxiety associated with GAD is also difficult to control. GAD can vary from mild to severe. People with severe GAD can have intense waves of anxiety with physical symptoms (panic attacks).  SYMPTOMS The anxiety and worry associated with GAD are difficult to control. This anxiety and worry are related to many life events and activities and also occur more days than not for 6 months or longer. People with  GAD also have three or more of the following symptoms (one or more in children):  Restlessness.   Fatigue.  Difficulty concentrating.   Irritability.  Muscle tension.  Difficulty sleeping or unsatisfying sleep. DIAGNOSIS GAD is diagnosed through an assessment by your health care provider. Your health care provider will ask you questions aboutyour mood,physical symptoms, and events in your life. Your health care provider may ask you about your medical history and use of alcohol or drugs, including prescription medicines. Your health care provider may also do a physical exam and blood tests. Certain medical conditions and the use of certain substances can cause symptoms similar to those associated with GAD. Your health care provider may refer you to a mental health specialist for further evaluation. TREATMENT The following therapies are usually used to treat GAD:   Medication. Antidepressant medication usually is prescribed for long-term daily control. Antianxiety medicines may be added in severe cases, especially when panic attacks occur.   Talk therapy (psychotherapy). Certain types of talk therapy can be helpful in treating GAD by providing support, education, and guidance. A form of talk therapy called cognitive behavioral therapy can teach you healthy ways to think about and react to daily life events and activities.  Stress managementtechniques. These include yoga, meditation, and exercise and can be very helpful when they are practiced regularly. A mental health specialist can help determine which treatment is best for you. Some people see improvement with one therapy. However, other people require a combination of therapies.   This information is not intended to replace advice given to you by your health care provider. Make sure you discuss any questions you have with your health care provider.   Document Released: 05/28/2012 Document Revised: 02/21/2014 Document Reviewed:  05/28/2012 Elsevier Interactive Patient Education [employer]

## 2014-11-25 NOTE — Progress Notes (Signed)
Quick Note:  Xray does not show calcifications. Refer to hand surgery ordered. ______

## 2014-12-31 ENCOUNTER — Other Ambulatory Visit: Payer: Self-pay | Admitting: Family Medicine

## 2015-01-01 MED ORDER — SERTRALINE HCL 50 MG PO TABS: 50.0000 mg | ORAL_TABLET | Freq: Every day | ORAL | Status: DC

## 2015-01-03 ENCOUNTER — Other Ambulatory Visit: Payer: Self-pay | Admitting: Family Medicine

## 2015-04-08 ENCOUNTER — Telehealth: Payer: Self-pay

## 2015-04-08 DIAGNOSIS — R6889 Other general symptoms and signs: Secondary | ICD-10-CM

## 2015-04-08 DIAGNOSIS — I7300 Raynaud's syndrome without gangrene: Secondary | ICD-10-CM

## 2015-04-08 NOTE — Telephone Encounter (Signed)
Pt psychiatrist would like for you to call her @ [phone].  She tried to refer pt to [facility] but they wouldn't accept pt due to her Sx.  She wants to see if you all could come up with a plan and agree on a endocrin. Specialist that is wiling to see pt.

## 2015-04-09 DIAGNOSIS — I7300 Raynaud's syndrome without gangrene: Secondary | ICD-10-CM | POA: Insufficient documentation

## 2015-04-09 NOTE — Telephone Encounter (Signed)
Pt.notified

## 2015-04-09 NOTE — Telephone Encounter (Signed)
Elizabeth Pruitt, Can you please let patient know that I've put in a referral to a Rheumatologist to further workup her cold intolerance.  (Contacted by [facility] (under the supervision of Dr. Bearl Mulberry at [facility]) about cold intolerance, especially extremities. Rheum referral to be placed.)

## 2015-05-14 ENCOUNTER — Ambulatory Visit (INDEPENDENT_AMBULATORY_CARE_PROVIDER_SITE_OTHER): Payer: Managed Care, Other (non HMO) | Admitting: Family Medicine

## 2015-05-14 ENCOUNTER — Encounter: Payer: Self-pay | Admitting: Family Medicine

## 2015-05-14 VITALS — BP 119/86 | HR 71 | Temp 98.0°F | Wt 109.0 lb

## 2015-05-14 DIAGNOSIS — R05 Cough: Secondary | ICD-10-CM

## 2015-05-14 DIAGNOSIS — R059 Cough, unspecified: Secondary | ICD-10-CM

## 2015-05-14 MED ORDER — ALBUTEROL SULFATE (2.5 MG/3ML) 0.083% IN NEBU: 2.5000 mg | INHALATION_SOLUTION | Freq: Four times a day (QID) | RESPIRATORY_TRACT | Status: DC | PRN

## 2015-05-14 MED ORDER — METHYLPREDNISOLONE ACETATE 80 MG/ML IJ SUSP
80.0000 mg | Freq: Once | INTRAMUSCULAR | Status: AC
  Administered 2015-05-14: 80 mg via INTRAMUSCULAR

## 2015-05-14 MED ORDER — AMOXICILLIN-POT CLAVULANATE 500-125 MG PO TABS: ORAL_TABLET | ORAL | Status: DC

## 2015-05-14 NOTE — Progress Notes (Signed)
CC: Elizabeth Pruitt is a 55 y.o. female is here for No chief complaint on file.   Subjective: HPI:  Nonproductive cough present for the last 8 days worsening on a daily basis now interfering with sleep. Nothing seems to make it better or worse. No interventions as of yet other than waiting. Denies nasal congestion, wheezing, shortness of breath, no chest tightness. Denies fevers, chills or rash. Symptoms are currently moderate in severity   Review Of Systems Outlined In HPI  Past Medical History  Diagnosis Date  . 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 Sore Throat - Dr. Dirk DressKathy Teasdall ENT 11/23/2011    Past Surgical History  Procedure Laterality Date  . Cesarean section    . Tonsillectomy     No family history on file.  Social History   Social History  . Marital Status: Married    Spouse Name: N/A  . Number of Children: N/A  . Years of Education: N/A   Occupational History  . Not on file.   Social History Main Topics  . Smoking status: Former Games developermoker  . Smokeless tobacco: Never Used  . Alcohol Use: No  . Drug Use: No  . Sexual Activity: Yes   Other Topics Concern  . Not on file   Social History Narrative     Objective: BP 119/86 mmHg  Pulse 71  Temp(Src) 98 F (36.7 C)  Wt 109 lb (49.442 kg)  SpO2 95%  General: Alert and Oriented, No Acute Distress HEENT: Pupils equal, round, reactive to light. Conjunctivae clear.  External ears unremarkable, canals clear with intact TMs with appropriate landmarks.  Middle ear appears open without effusion. Pink inferior turbinates.  Moist mucous membranes, pharynx without inflammation nor lesions.  Neck supple without palpable lymphadenopathy nor abnormal masses. Lungs: Clear to auscultation bilaterally, no wheezing/ronchi/rales.  Comfortable work of breathing. Good air movement. Extremities: No peripheral edema.  Strong peripheral pulses.  Mental Status: No  depression, anxiety, nor agitation. Skin: Warm and dry.  Assessment & Plan: Diagnoses and all orders for this visit:  Cough -     albuterol (PROVENTIL) (2.5 MG/3ML) 0.083% nebulizer solution; Take 3 mLs (2.5 mg total) by nebulization every 6 (six) hours as needed for wheezing or shortness of breath. -     amoxicillin-clavulanate (AUGMENTIN) 500-125 MG tablet; Take one by mouth every 8 hours for ten total days.   Cough: Refills on albuterol, start using nebulizer on a every 6 hours scheduled. Start Augmentin and she received 80 mg of IM Depo-Medrol today.  No Follow-up on file.

## 2015-05-14 NOTE — Addendum Note (Signed)
Addended by: Thom ChimesHENRY, Calisa Luckenbaugh M on: 05/14/2015 01:02 PM   Modules accepted: Orders

## 2015-05-20 ENCOUNTER — Telehealth: Payer: Self-pay

## 2015-05-20 MED ORDER — PREDNISONE 20 MG PO TABS: ORAL_TABLET | ORAL | Status: AC

## 2015-05-20 MED ORDER — AZITHROMYCIN 250 MG PO TABS: ORAL_TABLET | ORAL | Status: AC

## 2015-05-20 NOTE — Telephone Encounter (Signed)
Pt.notified

## 2015-05-20 NOTE — Telephone Encounter (Signed)
Pt called stating that she is not better and would like to know can something else be called to pharmacy.  Please advise.

## 2015-05-20 NOTE — Telephone Encounter (Signed)
Stop amoxicillin and start azithromycin and prednisone sent to [facility]

## 2015-09-02 ENCOUNTER — Encounter: Payer: Self-pay | Admitting: Emergency Medicine

## 2015-09-02 ENCOUNTER — Emergency Department
Admission: EM | Admit: 2015-09-02 | Discharge: 2015-09-02 | Disposition: A | Discharge status: Home / Self Care | Payer: Managed Care, Other (non HMO) | Source: Home / Self Care | Attending: Family Medicine | Admitting: Family Medicine

## 2015-09-02 ENCOUNTER — Emergency Department (INDEPENDENT_AMBULATORY_CARE_PROVIDER_SITE_OTHER): Payer: Managed Care, Other (non HMO)

## 2015-09-02 DIAGNOSIS — R053 Chronic cough: Secondary | ICD-10-CM

## 2015-09-02 DIAGNOSIS — R05 Cough: Secondary | ICD-10-CM

## 2015-09-02 MED ORDER — ALBUTEROL SULFATE HFA 108 (90 BASE) MCG/ACT IN AERS: 1.0000 | INHALATION_SPRAY | Freq: Four times a day (QID) | RESPIRATORY_TRACT | Status: DC | PRN

## 2015-09-02 MED ORDER — GUAIFENESIN 100 MG/5ML PO LIQD: 400.0000 mg | Freq: Four times a day (QID) | ORAL | Status: DC | PRN

## 2015-09-02 MED ORDER — DOXYCYCLINE HYCLATE 100 MG PO CAPS: 100.0000 mg | ORAL_CAPSULE | Freq: Two times a day (BID) | ORAL | Status: DC

## 2015-09-02 NOTE — ED Provider Notes (Signed)
CSN: [id number]     Arrival date & time 09/02/15  1404 History   First MD Initiated Contact with Patient 09/02/15 1423     Chief Complaint  Patient presents with  . Cough  . Chills   (Consider location/radiation/quality/duration/timing/severity/associated sxs/prior Treatment) HPI  Elizabeth Pruitt is a 55 y.o. female presenting to UC with c/o 3 weeks of mild to moderately productive cough for 3 weeks, associated chills over the last 3 days.  She reports hx of pneumonia twice before.  She has been taking OTC medication w/o relief. Denies hx of asthma but has had an inhaler in the past when she had pneumonia. She notes she is leaving in 2 days for an out of town wedding.  Denies n/v/d.    Past Medical History  Diagnosis Date  . 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 Sore Throat - Dr. Dirk DressKathy Teasdall ENT 11/23/2011   Past Surgical History  Procedure Laterality Date  . Cesarean section    . Tonsillectomy     History reviewed. No pertinent family history. Social History  Substance Use Topics  . Smoking status: Former Games developermoker  . Smokeless tobacco: Never Used  . Alcohol Use: No   OB History    No data available     Review of Systems  Constitutional: Positive for chills. Negative for fever.  HENT: Positive for congestion and rhinorrhea. Negative for ear pain, sore throat, trouble swallowing and voice change.   Respiratory: Positive for cough. Negative for chest tightness and shortness of breath.   Cardiovascular: Negative for chest pain and palpitations.  Gastrointestinal: Negative for nausea, vomiting, abdominal pain and diarrhea.  Musculoskeletal: Negative for myalgias, back pain and arthralgias.  Skin: Negative for rash.    Allergies  Sulfa antibiotics and Sulfa drugs cross reactors  Home Medications   Prior to Admission medications   Medication Sig Start Date End Date Taking? Authorizing Provider  albuterol  (PROVENTIL HFA;VENTOLIN HFA) 108 (90 Base) MCG/ACT inhaler Inhale 1-2 puffs into the lungs every 6 (six) hours as needed for wheezing or shortness of breath. 09/02/15   Junius FinnerErin O'Malley, PA-C  albuterol (PROVENTIL) (2.5 MG/3ML) 0.083% nebulizer solution Take 3 mLs (2.5 mg total) by nebulization every 6 (six) hours as needed for wheezing or shortness of breath. 05/14/15   Laren BoomSean Hommel, DO  doxycycline (VIBRAMYCIN) 100 MG capsule Take 1 capsule (100 mg total) by mouth 2 (two) times daily. One po bid x 7 days 09/02/15   Junius FinnerErin O'Malley, PA-C  guaiFENesin (ROBITUSSIN) 100 MG/5ML liquid Take 20 mLs (400 mg total) by mouth 4 (four) times daily as needed for cough or congestion. 09/02/15   Junius FinnerErin O'Malley, PA-C  sertraline (ZOLOFT) 50 MG tablet Take 1 tablet (50 mg total) by mouth daily. 01/01/15   Laren BoomSean Hommel, DO   Meds Ordered and Administered this Visit  Medications - No data to display  BP 128/84 mmHg  Pulse 75  Temp(Src) 98.3 F (36.8 C) (Oral)  Resp 18  Ht 5' (1.524 m)  Wt 107 lb (48.535 kg)  BMI 20.90 kg/m2  SpO2 98% No data found.   Physical Exam  Constitutional: She appears well-developed and well-nourished. No distress.  HENT:  Head: Normocephalic and atraumatic.  Right Ear: Tympanic membrane normal.  Left Ear: Tympanic membrane normal.  Nose: Nose normal.  Mouth/Throat: Uvula is midline, oropharynx is clear and moist and mucous membranes are normal.  Eyes: Conjunctivae are normal. No scleral icterus.  Neck: Normal range of motion. Neck supple.  Cardiovascular: Normal rate, regular rhythm and normal heart sounds.   Pulmonary/Chest: Effort normal. No respiratory distress. She has wheezes (faint expiratory wheeze). She has no rales.  Abdominal: Soft. She exhibits no distension. There is no tenderness.  Musculoskeletal: Normal range of motion.  Neurological: She is alert.  Skin: Skin is warm and dry. She is not diaphoretic.  Nursing note and vitals reviewed.   ED Course  Procedures  (including critical care time)  Labs Review Labs Reviewed - No data to display  Imaging Review Dg Chest 2 View  09/02/2015  CLINICAL DATA:  Cough and congestion for 3 weeks EXAM: CHEST  2 VIEW COMPARISON:  None. FINDINGS: Lungs are clear. Heart size and pulmonary vascularity are normal. No adenopathy. No bone lesions. IMPRESSION: No edema or consolidation. Electronically Signed   By: Bretta Bang III M.D.   On: 09/02/2015 14:44     MDM   1. Persistent cough for 3 weeks or longer    Pt c/o 3 weeks of persistent cough, associated chills that started 3 days ago.   CXR: negative for pneumonia, however, due to duration of cough and wheeze on exam, will cover for bacterial bronchitis.  Pt notes "amoxicillin doesn't work and azithromycin makes me sick"  Rx: doxycycline, guaifenesin, and albuterol inhaler (she has had prednisone in the past but does not like side effects)  Encouraged fluids and rest. May call PCP or [facility] if symptoms not improving while pt is out of town. Patient verbalized understanding and agreement with treatment plan.     Junius Finner, PA-C 09/02/15 1550

## 2015-09-02 NOTE — Discharge Instructions (Signed)
You may take 400-600mg Ibuprofen (Motrin) every 6-8 hours for fever and pain  °Alternate with Tylenol  °You may take 500mg Tylenol every 4-6 hours as needed for fever and pain  °Follow-up with your primary care provider next week for recheck of symptoms if not improving.  °Be sure to drink plenty of fluids and rest, at least 8hrs of sleep a night, preferably more while you are sick. °Return urgent care or go to closest ER if you cannot keep down fluids/signs of dehydration, fever not reducing with Tylenol, difficulty breathing/wheezing, stiff neck, worsening condition, or other concerns (see below)  °Please take antibiotics as prescribed and be sure to complete entire course even if you start to feel better to ensure infection does not come back. ° ° °Cool Mist Vaporizers °Vaporizers may help relieve the symptoms of a cough and cold. They add moisture to the air, which helps mucus to become thinner and less sticky. This makes it easier to breathe and cough up secretions. Cool mist vaporizers do not cause serious burns like hot mist vaporizers, which may also be called steamers or humidifiers. Vaporizers have not been proven to help with colds. You should not use a vaporizer if you are allergic to mold. °HOME CARE INSTRUCTIONS °· Follow the package instructions for the vaporizer. °· Do not use anything other than distilled water in the vaporizer. °· Do not run the vaporizer all of the time. This can cause mold or bacteria to grow in the vaporizer. °· Clean the vaporizer after each time it is used. °· Clean and dry the vaporizer well before storing it. °· Stop using the vaporizer if worsening respiratory symptoms develop. °  °This information is not intended to replace advice given to you by your health care provider. Make sure you discuss any questions you have with your health care provider. °  °Document Released: 10/29/2003 Document Revised: 02/05/2013 Document Reviewed: 06/20/2012 °Elsevier Interactive Patient  Education ©2016 Elsevier Inc. ° °

## 2015-09-02 NOTE — ED Notes (Signed)
Reports productive cough for 3 weeks with onset chills 3 days ago.

## 2015-11-04 ENCOUNTER — Ambulatory Visit (INDEPENDENT_AMBULATORY_CARE_PROVIDER_SITE_OTHER): Payer: Managed Care, Other (non HMO) | Admitting: Family Medicine

## 2015-11-04 ENCOUNTER — Encounter: Payer: Self-pay | Admitting: Family Medicine

## 2015-11-04 VITALS — BP 105/72 | HR 78 | Wt 110.0 lb

## 2015-11-04 DIAGNOSIS — Z1159 Encounter for screening for other viral diseases: Secondary | ICD-10-CM | POA: Diagnosis not present

## 2015-11-04 DIAGNOSIS — R6889 Other general symptoms and signs: Secondary | ICD-10-CM

## 2015-11-04 DIAGNOSIS — Z0000 Encounter for general adult medical examination without abnormal findings: Secondary | ICD-10-CM

## 2015-11-04 DIAGNOSIS — IMO0001 Reserved for inherently not codable concepts without codable children: Secondary | ICD-10-CM

## 2015-11-04 DIAGNOSIS — Z114 Encounter for screening for human immunodeficiency virus [HIV]: Secondary | ICD-10-CM | POA: Diagnosis not present

## 2015-11-04 DIAGNOSIS — R448 Other symptoms and signs involving general sensations and perceptions: Secondary | ICD-10-CM

## 2015-11-04 NOTE — Patient Instructions (Signed)
Thank you for coming in today. Please try to send records from your last Pap smear and mammogram.  Get fasting labs in the near future.  Once the labs are back I will send the form off.    Osteoporosis Osteoporosis is the thinning and loss of density in the bones. Osteoporosis makes the bones more brittle, fragile, and likely to break (fracture). Over time, osteoporosis can cause the bones to become so weak that they fracture after a simple fall. The bones most likely to fracture are the bones in the hip, wrist, and spine. CAUSES  The exact cause is not known. RISK FACTORS Anyone can develop osteoporosis. You may be at greater risk if you have a family history of the condition or have poor nutrition. You may also have a higher risk if you are:   Female.   57 years old or older.  A smoker.  Not physically active.   White or Asian.  Slender. SIGNS AND SYMPTOMS  A fracture might be the first sign of the disease, especially if it results from a fall or injury that would not usually cause a bone to break. Other signs and symptoms include:   Low back and neck pain.  Stooped posture.  Height loss. DIAGNOSIS  To make a diagnosis, your health care provider may:  Take a medical history.  Perform a physical exam.  Order tests, such as:  A bone mineral density test.  A dual-energy X-ray absorptiometry test. TREATMENT  The goal of osteoporosis treatment is to strengthen your bones to reduce your risk of a fracture. Treatment may involve:  Making lifestyle changes, such as:  Eating a diet rich in calcium.  Doing weight-bearing and muscle-strengthening exercises.  Stopping tobacco use.  Limiting alcohol intake.  Taking medicine to slow the process of bone loss or to increase bone density.  Monitoring your levels of calcium and vitamin D. HOME CARE INSTRUCTIONS  Include calcium and vitamin D in your diet. Calcium is important for bone health, and vitamin D helps the  body absorb calcium.  Perform weight-bearing and muscle-strengthening exercises as directed by your health care provider.  Do not use any tobacco products, including cigarettes, chewing tobacco, and electronic cigarettes. If you need help quitting, ask your health care provider.  Limit your alcohol intake.  Take medicines only as directed by your health care provider.  Keep all follow-up visits as directed by your health care provider. This is important.  Take precautions at home to lower your risk of falling, such as:  Keeping rooms well lit and clutter free.  Installing safety rails on stairs.  Using rubber mats in the bathroom and other areas that are often wet or slippery. SEEK IMMEDIATE MEDICAL CARE IF:  You fall or injure yourself.    This information is not intended to replace advice given to you by your health care provider. Make sure you discuss any questions you have with your health care provider.   Document Released: 11/10/2004 Document Revised: 02/21/2014 Document Reviewed: 07/11/2013 Elsevier Interactive Patient Education [employer]2016 Elsevier Inc.

## 2015-11-04 NOTE — Progress Notes (Signed)
       Elizabeth Pruitt is a 55 y.o. female who [facility] today for a visit. Patient is in good health. She feels well with no fevers nausea vomiting or diarrhea. She does note that she feels cold often. She notes she's had her thyroid checked and it has been normal in the past. She thinks her mammogram is up-to-date noting that it was done at an outside facility less than a year ago. Additionally she thinks her Pap smears are up-to-date. [place] and does not think she'll will be able to provide records.   She is not interested in bone density testing, colon cancer screening influenza vaccine and tetanus vaccine.    Past Medical History:  Diagnosis Date  . 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 Sore Throat - Dr. Dirk DressKathy Teasdall ENT 11/23/2011   Past Surgical History:  Procedure Laterality Date  . CESAREAN SECTION    . TONSILLECTOMY     Social History  Substance Use Topics  . Smoking status: Former Games developermoker  . Smokeless tobacco: Never Used  . Alcohol use No   family history is not on file.  ROS as above:  Medications: Current Outpatient Prescriptions  Medication Sig Dispense Refill  . albuterol (PROVENTIL HFA;VENTOLIN HFA) 108 (90 Base) MCG/ACT inhaler Inhale 1-2 puffs into the lungs every 6 (six) hours as needed for wheezing or shortness of breath. 1 Inhaler 1  . albuterol (PROVENTIL) (2.5 MG/3ML) 0.083% nebulizer solution Take 3 mLs (2.5 mg total) by nebulization every 6 (six) hours as needed for wheezing or shortness of breath. 150 mL 1   No current facility-administered medications for this visit.    Allergies  Allergen Reactions  . Sulfa Antibiotics   . Sulfa Drugs Cross Reactors Hives    Red splotches all over body     Exam:  BP 105/72   Pulse 78   Wt  110 lb (49.9 kg)   BMI 21.48 kg/m  Gen: Well NAD HEENT: EOMI,  MMM No goiter  Lungs: Normal work of breathing. CTABL Heart: RRR no MRG Abd: NABS, Soft. Nondistended, Nontender Exts: Brisk capillary refill, warm and well perfused. No skin or hair changes  No results found for this or any previous visit (from the past 24 hour(s)). No results found.    Assessment and Plan: 55 y.o. female with  Will adult. Obtain fasting labs as well as TSH to evaluate for feeling cold. Additionally obtain HIV and hepatitis C screenings as part of routine health maintenance. Recommend colon cancer density screening. Patient declines as noted above.   Orders Placed This Encounter  Procedures  . CBC  . Comprehensive metabolic panel    Order Specific Question:   Has the patient fasted?    Answer:   No  . Hemoglobin A1c  . Lipid panel    Order Specific Question:   Has the patient fasted?    Answer:   No  . Hepatitis C antibody  . HIV antibody  . TSH  . VITAMIN D 25 Hydroxy (Vit-D Deficiency, Fractures)    Discussed warning signs or symptoms. Please see discharge instructions. Patient expresses understanding.

## 2015-11-06 LAB — CBC
HCT: 38.2 % (ref 35.0–45.0)
Hemoglobin: 12.7 g/dL (ref 11.7–15.5)
MCH: 29.7 pg (ref 27.0–33.0)
MCHC: 33.2 g/dL (ref 32.0–36.0)
MCV: 89.3 fL (ref 80.0–100.0)
MPV: 10 fL (ref 7.5–12.5)
PLATELETS: 301 10*3/uL (ref 140–400)
RBC: 4.28 MIL/uL (ref 3.80–5.10)
RDW: 12.8 % (ref 11.0–15.0)
WBC: 4.2 10*3/uL (ref 3.8–10.8)

## 2015-11-06 LAB — TSH: TSH: 0.93 m[IU]/L

## 2015-11-06 LAB — LIPID PANEL
Cholesterol: 223 mg/dL — ABNORMAL HIGH (ref 125–200)
HDL: 81 mg/dL (ref >=46)
LDL CALC: 119 mg/dL (ref <130)
TRIGLYCERIDES: 115 mg/dL (ref <150)
Total CHOL/HDL Ratio: 2.8 Ratio (ref <=5.0)
VLDL: 23 mg/dL (ref <30)

## 2015-11-06 LAB — COMPREHENSIVE METABOLIC PANEL
ALK PHOS: 60 U/L (ref 33–130)
ALT: 10 U/L (ref 6–29)
AST: 19 U/L (ref 10–35)
Albumin: 4.2 g/dL (ref 3.6–5.1)
BILIRUBIN TOTAL: 0.5 mg/dL (ref 0.2–1.2)
BUN: 11 mg/dL (ref 7–25)
CO2: 27 mmol/L (ref 20–31)
CREATININE: 0.66 mg/dL (ref 0.50–1.05)
Calcium: 9.3 mg/dL (ref 8.6–10.4)
Chloride: 103 mmol/L (ref 98–110)
GLUCOSE: 87 mg/dL (ref 65–99)
POTASSIUM: 4.2 mmol/L (ref 3.5–5.3)
SODIUM: 139 mmol/L (ref 135–146)
TOTAL PROTEIN: 6.8 g/dL (ref 6.1–8.1)

## 2015-11-07 LAB — HEMOGLOBIN A1C
HEMOGLOBIN A1C: 5.4 % (ref <5.7)
Mean Plasma Glucose: 108 mg/dL

## 2015-11-07 LAB — HEPATITIS C ANTIBODY: HCV AB: NEGATIVE

## 2015-11-07 LAB — HIV ANTIBODY (ROUTINE TESTING W REFLEX): HIV 1&2 Ab, 4th Generation: NONREACTIVE

## 2015-11-07 LAB — VITAMIN D 25 HYDROXY (VIT D DEFICIENCY, FRACTURES): Vit D, 25-Hydroxy: 30 ng/mL (ref 30–100)

## 2015-11-23 ENCOUNTER — Telehealth: Payer: Self-pay | Admitting: Family Medicine

## 2015-11-23 NOTE — Telephone Encounter (Signed)
Pt called in to ask about a CPE form that she had asked be faxed. She said her husband's employer had not received it yet and that was 2 weeks ago. Do we still have that form? Thanks!

## 2015-11-24 NOTE — Telephone Encounter (Signed)
Form faxed on 11/10/2015. Will refax.

## 2015-11-26 ENCOUNTER — Telehealth: Payer: Self-pay | Admitting: Family Medicine

## 2015-11-26 NOTE — Telephone Encounter (Signed)
Fine with me if fine with Dr. Denyse Amassorey

## 2015-11-26 NOTE — Telephone Encounter (Signed)
Patient called and adv if she can change providers to Elizabeth NielsenNatalie Pruitt female instead of Dr. Denyse Amassorey. Thanks

## 2015-11-26 NOTE — Telephone Encounter (Signed)
OK with me.

## 2016-01-26 ENCOUNTER — Emergency Department
Admission: EM | Admit: 2016-01-26 | Discharge: 2016-01-26 | Disposition: A | Discharge status: Home / Self Care | Payer: Managed Care, Other (non HMO) | Source: Home / Self Care | Attending: Family Medicine | Admitting: Family Medicine

## 2016-01-26 ENCOUNTER — Telehealth: Payer: Self-pay | Admitting: *Deleted

## 2016-01-26 ENCOUNTER — Encounter: Payer: Self-pay | Admitting: *Deleted

## 2016-01-26 DIAGNOSIS — J019 Acute sinusitis, unspecified: Secondary | ICD-10-CM | POA: Diagnosis not present

## 2016-01-26 MED ORDER — IPRATROPIUM BROMIDE 0.06 % NA SOLN: 2.0000 | Freq: Four times a day (QID) | NASAL | 0 refills | Status: DC

## 2016-01-26 MED ORDER — AMOXICILLIN 500 MG PO CAPS: 500.0000 mg | ORAL_CAPSULE | Freq: Three times a day (TID) | ORAL | 0 refills | Status: AC

## 2016-01-26 NOTE — ED Triage Notes (Signed)
patient c/o 10 days of cold symptoms. The past 3 days sinus pain, thick colored drainage and chills. Taken IBF.

## 2016-01-26 NOTE — Telephone Encounter (Signed)
Patient reports she was asked while she was here if she'd like a nasal spray prescribed and declined. She has changed her mind and would like a nasal spray sent in to her pharmacy please.

## 2016-01-26 NOTE — ED Provider Notes (Signed)
CSN: [id number]     Arrival date & time 01/26/16  [id number] History   First MD Initiated Contact with Patient 01/26/16 0915     Chief Complaint  Patient presents with  . Sinus Problem   (Consider location/radiation/quality/duration/timing/severity/associated sxs/prior Treatment) HPI  Elizabeth Pruitt is a 55 y.o. female presenting to UC with c/o 10 days of cold-like symptoms with cough and congestion that has developed into 3 days of sinus pain and pressure.  She reports thick colored drainage and chills.  She has been taking ibuprofen and has used nasal saline with mild temporary relief.  Denies fever, n/v/d. Others have been sick around her. No recent travel. Hx of sinus infections in the past.    Past Medical History:  Diagnosis Date  . 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 Sore Throat - Dr. Dirk DressKathy Teasdall ENT 11/23/2011   Past Surgical History:  Procedure Laterality Date  . CESAREAN SECTION    . TONSILLECTOMY     History reviewed. No pertinent family history. Social History  Substance Use Topics  . Smoking status: Former Games developermoker  . Smokeless tobacco: Never Used  . Alcohol use No   OB History    No data available     Review of Systems  Constitutional: Positive for chills. Negative for fever.  HENT: Positive for congestion, postnasal drip, rhinorrhea, sinus pain and sinus pressure. Negative for ear pain, sore throat, trouble swallowing and voice change.   Respiratory: Positive for cough. Negative for shortness of breath.   Cardiovascular: Negative for chest pain and palpitations.  Gastrointestinal: Negative for abdominal pain, diarrhea, nausea and vomiting.  Musculoskeletal: Negative for arthralgias, back pain and myalgias.  Skin: Negative for rash.  Neurological: Positive for headaches ( frontal). Negative for dizziness and light-headedness.    Allergies  Sulfa antibiotics and Sulfa drugs cross reactors  Home  Medications   Prior to Admission medications   Medication Sig Start Date End Date Taking? Authorizing Provider  albuterol (PROVENTIL HFA;VENTOLIN HFA) 108 (90 Base) MCG/ACT inhaler Inhale 1-2 puffs into the lungs every 6 (six) hours as needed for wheezing or shortness of breath. 09/02/15   Junius FinnerErin O'Malley, PA-C  amoxicillin (AMOXIL) 500 MG capsule Take 1 capsule (500 mg total) by mouth 3 (three) times daily. For 10 days 01/26/16 02/05/16  Junius FinnerErin O'Malley, PA-C   Meds Ordered and Administered this Visit  Medications - No data to display  BP 120/81 (BP Location: Left Arm)   Pulse 72   Temp 98.2 F (36.8 C) (Oral)   Resp 14   Wt 107 lb (48.5 kg)   SpO2 98%   BMI 20.90 kg/m  No data found.   Physical Exam  Constitutional: She is oriented to person, place, and time. She appears well-developed and well-nourished. No distress.  HENT:  Head: Normocephalic and atraumatic.  Right Ear: Tympanic membrane normal.  Left Ear: Tympanic membrane normal.  Nose: Mucosal edema present. Right sinus exhibits maxillary sinus tenderness and frontal sinus tenderness. Left sinus exhibits maxillary sinus tenderness and frontal sinus tenderness.  Eyes: EOM are normal.  Neck: Normal range of motion. Neck supple.  Cardiovascular: Normal rate and regular rhythm.   Pulmonary/Chest: Effort normal and breath sounds normal. No stridor. No respiratory distress. She has no wheezes. She has no rales.  Musculoskeletal: Normal range of motion.  Lymphadenopathy:    She has no cervical adenopathy.  Neurological: She is alert and oriented to person, place, and  time.  Skin: Skin is warm and dry. She is not diaphoretic.  Psychiatric: She has a normal mood and affect. Her behavior is normal.  Nursing note and vitals reviewed.   Urgent Care Course   Clinical Course     Procedures (including critical care time)  Labs Review Labs Reviewed - No data to display  Imaging Review No results found.    MDM   1. Acute  rhinosinusitis    Pt c/o worsening sinus pain and pressure for 3 days, following 10 days of URI symptoms. Sinus tenderness on exam.  Although pt stated [facility] that "amoxicillin did not work"  Pt notes it does work and she prefers the capsule form of medicine as the tablets tend to cause stomach upset.  Rx: Amoxicillin TID for 10 days Encouraged to take medicine with food.  F/u with PCP in 1 week if not improving.    Junius Finnerrin O'Malley, PA-C 01/26/16 0930

## 2016-01-28 ENCOUNTER — Telehealth: Payer: Self-pay | Admitting: Emergency Medicine

## 2016-01-28 NOTE — Telephone Encounter (Signed)
Patient wants to change ABT however she has been taking less than 48 hrs, Dr Cathren HarshBeese would like for her to continue a couple of more days, use saline nasal spray, Afrin, Flonase, nasal rinses, 800 mgs Ibuprofen q 8 hrs with food before changing.

## 2016-09-01 ENCOUNTER — Ambulatory Visit (INDEPENDENT_AMBULATORY_CARE_PROVIDER_SITE_OTHER): Payer: Commercial Managed Care - PPO | Admitting: Osteopathic Medicine

## 2016-09-01 ENCOUNTER — Encounter: Payer: Self-pay | Admitting: Osteopathic Medicine

## 2016-09-01 VITALS — BP 123/78 | HR 82 | Wt 102.0 lb

## 2016-09-01 DIAGNOSIS — Z1211 Encounter for screening for malignant neoplasm of colon: Secondary | ICD-10-CM

## 2016-09-01 DIAGNOSIS — Z0000 Encounter for general adult medical examination without abnormal findings: Secondary | ICD-10-CM | POA: Diagnosis not present

## 2016-09-01 DIAGNOSIS — R6889 Other general symptoms and signs: Secondary | ICD-10-CM | POA: Diagnosis not present

## 2016-09-01 LAB — CBC
HCT: 39.4 % (ref 35.0–45.0)
Hemoglobin: 13 g/dL (ref 11.7–15.5)
MCH: 30.3 pg (ref 27.0–33.0)
MCHC: 33 g/dL (ref 32.0–36.0)
MCV: 91.8 fL (ref 80.0–100.0)
MPV: 10 fL (ref 7.5–12.5)
Platelets: 296 10*3/uL (ref 140–400)
RBC: 4.29 MIL/uL (ref 3.80–5.10)
RDW: 13.3 % (ref 11.0–15.0)
WBC: 5.5 10*3/uL (ref 3.8–10.8)

## 2016-09-01 LAB — TSH: TSH: 0.88 mIU/L

## 2016-09-01 NOTE — Progress Notes (Signed)
HPI: Elizabeth Pruitt is a 56 y.o. female  who presents to [facility] [place] today, 09/01/16,  for chief complaint of:  Chief Complaint  Patient presents with  . Annual Exam    Patient here for annual physical / wellness exam, however, has additional concerns.  See preventive care reviewed as below.  Recent labs reviewed in detail with the patient.  Of note, she had a well-adult visit with Dr. Denyse Amassorey 10/2015 billed [phone]  Additional concerns today include: Needs biometric form completed for insurance  Wants labs checked - thyroid, iron, vitamin D, blood count - had all these checked by Dr. Denyse Amassorey.  Cold intolerance, chronic    Past medical, surgical, social and family history reviewed: Patient Active Problem List   Diagnosis Date Noted  . Raynaud phenomenon 04/09/2015  . GAD (generalized anxiety disorder) 11/24/2014  . Ganglion cyst 11/24/2014  . Vasomotor flushing 07/30/2013  . Muscle spasm 01/06/2012  . Neck pain 01/06/2012  . Bilateral hand numbness 01/06/2012  . Sore Throat - Dr. Dirk DressKathy Teasdall ENT 11/23/2011  . 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 Dysphagia 11/09/2011  . 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 CESAREAN SECTION    . TONSILLECTOMY     Social History  Substance Use Topics  . Smoking status: Former Games developermoker  . Smokeless tobacco: Never Used  . Alcohol use No   No family history on file.   Current medication list and allergy/intolerance information reviewed:   Current Outpatient Prescriptions  Medication Sig Dispense Refill  . albuterol (PROVENTIL HFA;VENTOLIN HFA) 108 (90 Base) MCG/ACT inhaler Inhale 1-2 puffs into the lungs every 6 (six) hours as needed for wheezing or shortness of breath. 1 Inhaler 1  . EPIPEN 2-PAK 0.3 MG/0.3ML SOAJ injection 1 (ONE) PRE-FILLED PEN SYRINGE AS NEEDED FOR EMERGENCY USE  0  . ipratropium (ATROVENT) 0.06 % nasal  spray Place 2 sprays into both nostrils 4 (four) times daily. For up to 1 week 15 mL 0   No current facility-administered medications for this visit.    Allergies  Allergen Reactions  . Sulfa Antibiotics   . Sulfa Drugs Cross Reactors Hives    Red splotches all over body      Review of Systems:  Constitutional:  No  fever, no chills, No recent illness, No unintentional weight changes. No significant fatigue.   HEENT: No  headache, no vision change, no hearing change, No sore throat, No  sinus pressure  Cardiac: No  chest pain, No  pressure, No palpitations  Respiratory:  No  shortness of breath. No  Cough  Gastrointestinal: No  abdominal pain, No  nausea  Musculoskeletal: No new myalgia/arthralgia  Skin: No  Rash  Endocrine: +cold intolerance,  No heat intolerance.  Neurologic: No  weakness, No  dizziness  Psychiatric: No  concerns with depression, No  concerns with anxiety, No sleep problems, No mood problems  Exam:  BP 123/78   Pulse 82   Wt 102 lb (46.3 kg)   SpO2 100%   BMI 19.92 kg/m   Constitutional: VS see above. General Appearance: alert, well-developed, well-nourished, NAD  Eyes: Normal lids and conjunctive, non-icteric sclera  Ears, Nose, Mouth, Throat: MMM, Normal external inspection ears/nares/mouth/lips/gums. TM normal bilaterally. Pharynx/tonsils no erythema, no exudate. Nasal mucosa normal.   Neck: No masses, trachea midline. No thyroid enlargement. No tenderness/mass appreciated. No lymphadenopathy  Respiratory: Normal respiratory effort. no wheeze, no  rhonchi, no rales  Cardiovascular: S1/S2 normal, no murmur, no rub/gallop auscultated. RRR. No lower extremity edema  Gastrointestinal: Nontender, no masses. No hepatomegaly, no splenomegaly. No hernia appreciated. Bowel sounds normal. Rectal exam deferred.   Musculoskeletal: Gait normal. No clubbing/cyanosis of digits.   Neurological: Normal balance/coordination. No tremor. No cranial nerve  deficit on limited exam. Motor and sensation intact and symmetric. Cerebellar reflexes intact.   Skin: warm, dry, intact. No rash/ulcer. No concerning nevi or subq nodules on limited exam.    Psychiatric: Normal judgment/insight. Normal mood and affect. Oriented x3.     ASSESSMENT/PLAN:   Annual physical exam - Plan: CBC, COMPLETE METABOLIC PANEL WITH GFR, Lipid panel, TSH, Ferritin, Iron and TIBC, VITAMIN D 25 Hydroxy (Vit-D Deficiency, Fractures)  Intolerant of cold - advised unlikely anything serious as long as labs ok, if persistent, consider endocrine referral, pt is concerned might be hormonal..  Colon cancer screening - Plan: Cologuard   FEMALE PREVENTIVE CARE Updated 09/01/16   ANNUAL SCREENING/COUNSELING  Diet/Exercise - HEALTHY HABITS DISCUSSED TO DECREASE CV RISK History  Smoking Status  . Former Smoker  Smokeless Tobacco  . Never Used   History  Alcohol Use No  None   Depression screen PHQ 2/9 09/01/2016  Decreased Interest 0  Down, Depressed, Hopeless 0  PHQ - 2 Score 0    Domestic violence concerns - no  HTN SCREENING - SEE VITALS  SEXUAL HEALTH  Sexually active in the past year - Yes with female.  Need/want STI testing today? - no  Concerns about libido or pain with sex? - no  Plans for pregnancy? - postmenopausal   INFECTIOUS DISEASE SCREENING  HIV - does not need  GC/CT - does not need  HepC - DOB [birth year] - does not need  TB - does not need  DISEASE SCREENING  Lipid - needs  DM2 - needs  Osteoporosis - women age 22+ - does not need  CANCER SCREENING  Cervical - has been more than a year, no records available  Breast - needs sometime this year   Lung - does not need  Colon - needs - opts for cologuard   ADULT VACCINATION  Influenza - annual vaccine recommended  Td - booster every 10 years recommended   Zoster - option at 21, yes at 60+   PCV13 - was not indicated  PPSV23 - was not indicated    Follow-up plan:  Return in about 1 year (around 09/01/2017) for ANNUAL PHYSICAL.  Visit summary with medication list and pertinent instructions was printed for patient to review, alert [place] if any changes needed. All questions at time of visit were answered - patient instructed to contact office with any additional concerns. ER/RTC precautions were reviewed with the patient and understanding verbalized.

## 2016-09-02 LAB — IRON AND TIBC
%SAT: 22 % (ref 11–50)
IRON: 76 ug/dL (ref 45–160)
TIBC: 349 ug/dL (ref 250–450)
UIBC: 273 ug/dL

## 2016-09-02 LAB — COMPLETE METABOLIC PANEL WITH GFR
ALT: 14 U/L (ref 6–29)
AST: 24 U/L (ref 10–35)
Albumin: 4.1 g/dL (ref 3.6–5.1)
Alkaline Phosphatase: 68 U/L (ref 33–130)
BILIRUBIN TOTAL: 0.4 mg/dL (ref 0.2–1.2)
BUN: 15 mg/dL (ref 7–25)
CALCIUM: 9.8 mg/dL (ref 8.6–10.4)
CHLORIDE: 106 mmol/L (ref 98–110)
CO2: 20 mmol/L (ref 20–31)
Creat: 0.66 mg/dL (ref 0.50–1.05)
GFR, Est African American: >89 mL/min (ref >=60)
GFR, Est Non African American: >89 mL/min (ref >=60)
Glucose, Bld: 88 mg/dL (ref 65–99)
Potassium: 4.4 mmol/L (ref 3.5–5.3)
Sodium: 142 mmol/L (ref 135–146)
Total Protein: 7.2 g/dL (ref 6.1–8.1)

## 2016-09-02 LAB — LIPID PANEL
Cholesterol: 247 mg/dL — ABNORMAL HIGH (ref <200)
HDL: 97 mg/dL (ref >50)
LDL Cholesterol: 135 mg/dL — ABNORMAL HIGH (ref <100)
Total CHOL/HDL Ratio: 2.5 Ratio (ref <5.0)
Triglycerides: 74 mg/dL (ref <150)
VLDL: 15 mg/dL (ref <30)

## 2016-09-02 LAB — FERRITIN: FERRITIN: 33 ng/mL (ref 10–232)

## 2016-09-02 LAB — VITAMIN D 25 HYDROXY (VIT D DEFICIENCY, FRACTURES): Vit D, 25-Hydroxy: 26 ng/mL — ABNORMAL LOW (ref 30–100)

## 2017-02-15 ENCOUNTER — Encounter: Payer: Self-pay | Admitting: Emergency Medicine

## 2017-02-15 ENCOUNTER — Ambulatory Visit: Payer: Commercial Managed Care - PPO | Admitting: Osteopathic Medicine

## 2017-02-15 ENCOUNTER — Emergency Department
Admission: EM | Admit: 2017-02-15 | Discharge: 2017-02-15 | Disposition: A | Discharge status: Home / Self Care | Payer: Commercial Managed Care - PPO | Source: Home / Self Care | Attending: Family Medicine | Admitting: Family Medicine

## 2017-02-15 DIAGNOSIS — J019 Acute sinusitis, unspecified: Secondary | ICD-10-CM | POA: Diagnosis not present

## 2017-02-15 DIAGNOSIS — H9202 Otalgia, left ear: Secondary | ICD-10-CM

## 2017-02-15 MED ORDER — CEFDINIR 300 MG PO CAPS: 300.0000 mg | ORAL_CAPSULE | Freq: Two times a day (BID) | ORAL | 0 refills | Status: AC

## 2017-02-15 NOTE — ED Triage Notes (Signed)
Pt c/o left ear pain and sore throat x3 weeks. States it is getting worse.

## 2017-02-15 NOTE — ED Provider Notes (Signed)
[facility]    CSN: [id number] Arrival date & time: 02/15/17  1052     History   Chief Complaint Chief Complaint  Patient presents with  . Otalgia    HPI Elizabeth Pruitt is a 57 y.o. female.   HPI Elizabeth Pruitt is a 57 y.o. female presenting to UC with c/o 3 weeks of nasal congestion, cough, sore throat and ear pain. Symptoms were waxing and waning but have worsened and become more persistent the last 3-4 days. Left ear pain is most bothersome. Hx of sinus infections in the past. Denies fever, chills, n/v/d. She has tried OTC cough/cold medications w/o relief. No known sick contacts.   Past Medical History:  Diagnosis Date  . 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 Sore Throat - Dr. Dirk Dress ENT 11/23/2011    Patient Active Problem List   Diagnosis Date Noted  . Raynaud phenomenon 04/09/2015  . GAD (generalized anxiety disorder) 11/24/2014  . Ganglion cyst 11/24/2014  . Vasomotor flushing 07/30/2013  . Muscle spasm 01/06/2012  . Neck pain 01/06/2012  . Bilateral hand numbness 01/06/2012  . Sore Throat - Dr. Dirk Dress ENT 11/23/2011  . 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 Dysphagia 11/09/2011  . 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 CESAREAN SECTION    . TONSILLECTOMY      OB History    No data available       Home Medications    Prior to Admission medications   Medication Sig Start Date End Date Taking? Authorizing Provider  albuterol (PROVENTIL HFA;VENTOLIN HFA) 108 (90 Base) MCG/ACT inhaler Inhale 1-2 puffs into the lungs every 6 (six) hours as needed for wheezing or shortness of breath. 09/02/15   Lurene Shadow, PA-C  cefdinir (OMNICEF) 300 MG capsule Take 1 capsule (300 mg total) by mouth 2 (two) times daily for 10 days. 02/15/17 02/25/17  Lurene Shadow, PA-C  EPIPEN 2-PAK 0.3 MG/0.3ML SOAJ  injection 1 (ONE) PRE-FILLED PEN SYRINGE AS NEEDED FOR EMERGENCY USE 08/19/16   [provider]  ipratropium (ATROVENT) 0.06 % nasal spray Place 2 sprays into both nostrils 4 (four) times daily. For up to 1 week 01/26/16   Rolla Plate    Family History History reviewed. No pertinent family history.  Social History Social History   Tobacco Use  . Smoking status: Former Games developer  . Smokeless tobacco: Never Used  Substance Use Topics  . Alcohol use: No  . Drug use: No     Allergies   Sulfa antibiotics; Sulfa drugs cross reactors; and Amoxicillin   Review of Systems Review of Systems  Constitutional: Negative for chills and fever.  HENT: Positive for congestion, ear pain (Left), postnasal drip, sinus pressure, sinus pain and sore throat. Negative for trouble swallowing and voice change.   Respiratory: Positive for cough. Negative for shortness of breath.   Cardiovascular: Negative for chest pain and palpitations.  Gastrointestinal: Negative for abdominal pain, diarrhea, nausea and vomiting.  Musculoskeletal: Negative for arthralgias, back pain and myalgias.  Skin: Negative for rash.  Neurological: Positive for headaches. Negative for dizziness and light-headedness.     Physical Exam Triage Vital Signs ED Triage Vitals  Enc Vitals Group     BP 02/15/17 1131 137/88     Pulse Rate 02/15/17 1131 74     Resp --  Temp 02/15/17 1131 97.6 F (36.4 C)     Temp Source 02/15/17 1131 Oral     SpO2 02/15/17 1131 99 %     Weight 02/15/17 1131 107 lb (48.5 kg)     Height --      Head Circumference --      Peak Flow --      Pain Score 02/15/17 1132 0     Pain Loc --      Pain Edu? --      Excl. in GC? --    No data found.  Updated Vital Signs BP 137/88 (BP Location: Right Arm)   Pulse 74   Temp 97.6 F (36.4 C) (Oral)   Wt 107 lb (48.5 kg)   SpO2 99%   BMI 20.90 kg/m   Visual Acuity Right Eye Distance:   Left Eye Distance:   Bilateral Distance:     Right Eye Near:   Left Eye Near:    Bilateral Near:     Physical Exam  Constitutional: She is oriented to person, place, and time. She appears well-developed and well-nourished. No distress.  HENT:  Head: Normocephalic and atraumatic.  Right Ear: Tympanic membrane normal.  Left Ear: Tympanic membrane is not erythematous and not bulging. A middle ear effusion is present.  Nose: Mucosal edema present. Right sinus exhibits maxillary sinus tenderness and frontal sinus tenderness. Left sinus exhibits maxillary sinus tenderness and frontal sinus tenderness.  Mouth/Throat: Uvula is midline, oropharynx is clear and moist and mucous membranes are normal.  Eyes: EOM are normal.  Neck: Normal range of motion. Neck supple.  Cardiovascular: Normal rate and regular rhythm.  Pulmonary/Chest: Effort normal and breath sounds normal. No stridor. No respiratory distress. She has no wheezes. She has no rales.  Musculoskeletal: Normal range of motion.  Lymphadenopathy:    She has no cervical adenopathy.  Neurological: She is alert and oriented to person, place, and time.  Skin: Skin is warm and dry. She is not diaphoretic.  Psychiatric: She has a normal mood and affect. Her behavior is normal.  Nursing note and vitals reviewed.    UC Treatments / Results  Labs (all labs ordered are listed, but only abnormal results are displayed) Labs Reviewed - No data to display  EKG  EKG Interpretation None       Radiology No results found.  Procedures Procedures (including critical care time)  Medications Ordered in UC Medications - No data to display   Initial Impression / Assessment and Plan / UC Course  I have reviewed the triage vital signs and the nursing notes.  Pertinent labs & imaging results that were available during my care of the patient were reviewed by me and considered in my medical decision making (see chart for details).     Will cover for bacterial sinusitis  Home care  instructions provided F/u with PCP in 1 week if not improving.   Final Clinical Impressions(s) / UC Diagnoses   Final diagnoses:  Acute rhinosinusitis  Acute otalgia, left    ED Discharge Orders        Ordered    cefdinir (OMNICEF) 300 MG capsule  2 times daily     02/15/17 1206       Controlled Substance Prescriptions [place] Controlled Substance Registry consulted? Not Applicable   Rolla Platehelps, Dillie Burandt O, PA-C 02/15/17 1234

## 2017-02-15 NOTE — Discharge Instructions (Signed)
°  You may take 500mg acetaminophen every 4-6 hours or in combination with ibuprofen 400-600mg every 6-8 hours as needed for pain, inflammation, and fever. ° °Be sure to drink at least eight 8oz glasses of water to stay well hydrated and get at least 8 hours of sleep at night, preferably more while sick.  ° °

## 2017-03-13 ENCOUNTER — Other Ambulatory Visit: Payer: Self-pay

## 2017-03-13 MED ORDER — ALBUTEROL SULFATE HFA 108 (90 BASE) MCG/ACT IN AERS: 1.0000 | INHALATION_SPRAY | Freq: Four times a day (QID) | RESPIRATORY_TRACT | 1 refills | Status: DC | PRN

## 2017-04-18 ENCOUNTER — Encounter: Payer: Self-pay | Admitting: Osteopathic Medicine

## 2017-04-18 ENCOUNTER — Ambulatory Visit: Payer: Commercial Managed Care - PPO | Admitting: Osteopathic Medicine

## 2017-04-18 VITALS — BP 140/89 | HR 80 | Temp 97.5°F | Wt 106.0 lb

## 2017-04-18 DIAGNOSIS — J320 Chronic maxillary sinusitis: Secondary | ICD-10-CM | POA: Diagnosis not present

## 2017-04-18 MED ORDER — KETOROLAC TROMETHAMINE 30 MG/ML IJ SOLN: 30.0000 mg | Freq: Once | INTRAMUSCULAR | Status: DC

## 2017-04-18 MED ORDER — IPRATROPIUM BROMIDE 0.06 % NA SOLN: 2.0000 | Freq: Four times a day (QID) | NASAL | 0 refills | Status: DC

## 2017-04-18 MED ORDER — KETOROLAC TROMETHAMINE 30 MG/ML IJ SOLN
30.0000 mg | Freq: Once | INTRAMUSCULAR | Status: AC
Start: 2017-04-18 — End: 2017-04-18
  Administered 2017-04-18: 30 mg via INTRAMUSCULAR

## 2017-04-18 MED ORDER — ALBUTEROL SULFATE (2.5 MG/3ML) 0.083% IN NEBU: 2.5000 mg | INHALATION_SOLUTION | Freq: Four times a day (QID) | RESPIRATORY_TRACT | 1 refills | Status: DC | PRN

## 2017-04-18 MED ORDER — DOXYCYCLINE HYCLATE 100 MG PO TABS: 100.0000 mg | ORAL_TABLET | Freq: Two times a day (BID) | ORAL | 0 refills | Status: AC

## 2017-04-18 NOTE — Addendum Note (Signed)
Addended by: Delfino LovettHERNANDEZ, Jerrine Urschel L on: 04/18/2017 02:00 PM   Modules accepted: Orders

## 2017-04-18 NOTE — Patient Instructions (Signed)
We'll try different antibiotic, hopefully this will help persistent sinus infection. I think sinus infection explained by her symptoms and may also be triggering the cough you are experiencing. If cough worsens, we should get a chest x-ray.   Would also encourage use of Atrovent nasal spray to help decrease sinus drainage.  For pain/discomfort, ibuprofen 600 mg every 6 hours should help. Avoid use of oxycodone for sinus pain.  If no improvement, or if something changes or gets worse, please come see us!

## 2017-04-18 NOTE — Progress Notes (Signed)
HPI: Elizabeth Pruitt is a 57 y.o. 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 Dirk Dress ENT (11/23/2011).  she presents to [facility] today, 04/18/17,  for chief complaint of: Concern about sinuses, cough   For more than a month been feeling under the weather. Seen by UC for similar issue (this was 02/15/17), given abx (Omnicef per record review) for sinus and states has been having issues on and off since then. She has been trying home remedies such as sinus rinses without much relief. Drainage and congestion, facial pain, nausea, cold sweats. For several days has had a backache but cough started yesterday so she is worried about pneumonia. She is experiencing some sinus pressure and nausea, some feeling of chest congestion. "I had sinus drainage causing this bronchitis." "I had more pain after a dream that my sinuses were full of white foam."      Past medical, surgical, social and family history reviewed:  Patient Active Problem List   Diagnosis Date Noted  . Raynaud phenomenon 04/09/2015  . GAD (generalized anxiety disorder) 11/24/2014  . Ganglion cyst 11/24/2014  . Vasomotor flushing 07/30/2013  . Muscle spasm 01/06/2012  . Neck pain 01/06/2012  . Bilateral hand numbness 01/06/2012  . Sore Throat - Dr. Dirk Dress ENT 11/23/2011  . 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 Dysphagia 11/09/2011  . 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 CESAREAN SECTION    . TONSILLECTOMY      Social History   Tobacco Use  . Smoking status: Former Games developer  . Smokeless tobacco: Never Used  Substance Use Topics  . Alcohol use: No    No family history on file.   Current medication list and allergy/intolerance information reviewed:    Current  Outpatient Medications  Medication Sig Dispense Refill  . albuterol (PROVENTIL HFA;VENTOLIN HFA) 108 (90 Base) MCG/ACT inhaler Inhale 1-2 puffs into the lungs every 6 (six) hours as needed for wheezing or shortness of breath. 1 Inhaler 1  . EPIPEN 2-PAK 0.3 MG/0.3ML SOAJ injection 1 (ONE) PRE-FILLED PEN SYRINGE AS NEEDED FOR EMERGENCY USE  0  . ipratropium (ATROVENT) 0.06 % nasal spray Place 2 sprays into both nostrils 4 (four) times daily. For up to 1 week 15 mL 0   No current facility-administered medications for this visit.     Allergies  Allergen Reactions  . Sulfa Antibiotics   . Sulfa Drugs Cross Reactors Hives    Red splotches all over body  . Amoxicillin Other (See Comments)    "it doesn't work for me."      Review of Systems:  Constitutional:  No  fever, +chills, +recent illness, No unintentional weight changes. No significant fatigue.   HEENT: +headache, no vision change, no hearing change, No sore throat, No  sinus pressure  Cardiac: No  chest pain, No  pressure, No palpitations  Respiratory:  No  shortness of breath. +Cough  Gastrointestinal: No  abdominal pain, +nausea, No  vomiting,  No  blood in stool, No  diarrhea  Musculoskeletal: No new myalgia/arthralgia  Skin: No  Rash  Neurologic: No  weakness, No  dizziness  Exam:  BP 140/89   Pulse 80   Temp (!) 97.5 F (36.4 C) (Oral)   Wt 106 lb 0.6 oz (48.1 kg)  SpO2 99%   BMI 20.71 kg/m   Constitutional: VS see above. General Appearance: alert, well-developed, well-nourished, NAD  Eyes: Normal lids and conjunctive, non-icteric sclera  Ears, Nose, Mouth, Throat: MMM, Normal external inspection ears/nares/mouth/lips/gums. TM normal bilaterally. Pharynx/tonsils no erythema, no exudate. Nasal mucosa normal.   Neck: No masses, trachea midline. No tenderness/mass appreciated. No lymphadenopathy  Respiratory: Normal respiratory effort. no wheeze, no rhonchi, no rales  Cardiovascular: S1/S2 normal, no  murmur, no rub/gallop auscultated. RRR. No lower extremity edema.   Gastrointestinal: Nontender, no masses. Bowel sounds normal.  Musculoskeletal: Gait normal.   Neurological: Normal balance/coordination. No tremor.   Skin: warm, dry, intact.   Psychiatric: Normal judgment/insight. Normal mood and affect.     ASSESSMENT/PLAN:   Chronic maxillary sinusitis - Sounds like possibly resistant to Omnicef, we will escalate therapy to Doxycycline given persistence of symptoms. Consider CT sinus if no better. Patient declines chest x-ray today. She is advised not to wait a full 2 months again in the future if she is experiencing persistent symptoms as over-the-counter/home remedies may not be adequate treatment. She would like to know if there is any kind of shot leaking given the office such as vitamins to boost her immunity and will respond faster to the treatment, I advised we concert me do a Toradol shot for pain and patient accepts this intervention.    Meds ordered this encounter  Medications  . doxycycline (VIBRA-TABS) 100 MG tablet    Sig: Take 1 tablet (100 mg total) by mouth 2 (two) times daily for 7 days.    Dispense:  14 tablet    Refill:  0  . ipratropium (ATROVENT) 0.06 % nasal spray    Sig: Place 2 sprays into both nostrils 4 (four) times daily. For up to 1 week    Dispense:  15 mL    Refill:  0  . albuterol (PROVENTIL) (2.5 MG/3ML) 0.083% nebulizer solution    Sig: Take 3 mLs (2.5 mg total) by nebulization every 6 (six) hours as needed for wheezing or shortness of breath.    Dispense:  150 mL    Refill:  1     Patient Instructions  We'll try different antibiotic, hopefully this will help persistent sinus infection. I think sinus infection explained by her symptoms and may also be triggering the cough you are experiencing. If cough worsens, we should get a chest x-ray.   Would also encourage use of Atrovent nasal spray to help decrease sinus drainage.  For  pain/discomfort, ibuprofen 600 mg every 6 hours should help. Avoid use of oxycodone for sinus pain.  If no improvement, or if something changes or gets worse, please come see us!    Visit summary with medication list and pertinent instructions was printed for patient to review. All questions at time of visit were answered - patient instructed to contact office with any additional concerns. ER/RTC precautions were reviewed with the patient.   Follow-up plan: Return in about 1 week (around 04/25/2017) for recheck symptoms, sooner if needed .  Note: Total time spent 25 minutes, greater than 50% of the visit was spent face-to-face counseling and coordinating care for the following: The encounter diagnosis was Chronic maxillary sinusitis.Marland Kitchen.  Please note: voice recognition software was used to produce this document, and typos may escape review. Please contact Dr. Lyn HollingsheadAlexander for any needed clarifications.

## 2017-04-25 ENCOUNTER — Ambulatory Visit: Payer: Commercial Managed Care - PPO | Admitting: Osteopathic Medicine

## 2017-05-08 ENCOUNTER — Encounter: Payer: Self-pay | Admitting: Family Medicine

## 2017-05-08 ENCOUNTER — Ambulatory Visit (INDEPENDENT_AMBULATORY_CARE_PROVIDER_SITE_OTHER): Payer: Commercial Managed Care - PPO | Admitting: Family Medicine

## 2017-05-08 ENCOUNTER — Ambulatory Visit (INDEPENDENT_AMBULATORY_CARE_PROVIDER_SITE_OTHER): Payer: Commercial Managed Care - PPO

## 2017-05-08 VITALS — BP 153/98 | HR 98 | Temp 98.9°F | Wt 106.0 lb

## 2017-05-08 DIAGNOSIS — R079 Chest pain, unspecified: Secondary | ICD-10-CM

## 2017-05-08 DIAGNOSIS — R509 Fever, unspecified: Secondary | ICD-10-CM | POA: Diagnosis not present

## 2017-05-08 DIAGNOSIS — R030 Elevated blood-pressure reading, without diagnosis of hypertension: Secondary | ICD-10-CM

## 2017-05-08 DIAGNOSIS — R05 Cough: Secondary | ICD-10-CM

## 2017-05-08 DIAGNOSIS — R059 Cough, unspecified: Secondary | ICD-10-CM

## 2017-05-08 MED ORDER — AZITHROMYCIN 250 MG PO TABS: 250.0000 mg | ORAL_TABLET | Freq: Every day | ORAL | 0 refills | Status: DC

## 2017-05-08 MED ORDER — BENZONATATE 200 MG PO CAPS: 200.0000 mg | ORAL_CAPSULE | Freq: Three times a day (TID) | ORAL | 0 refills | Status: DC | PRN

## 2017-05-08 MED ORDER — PREDNISONE 10 MG PO TABS: 30.0000 mg | ORAL_TABLET | Freq: Every day | ORAL | 0 refills | Status: DC

## 2017-05-08 MED ORDER — GUAIFENESIN-CODEINE 100-10 MG/5ML PO SOLN: 5.0000 mL | Freq: Every evening | ORAL | 0 refills | Status: DC | PRN

## 2017-05-08 NOTE — Progress Notes (Signed)
Elizabeth Pruitt is a 57 y.o. female who [facility] today for cough wheezing and chest pain. Elizabeth Pruitt notes a little under a one-week history of body aches headaches fevers chills cough ingestion and chest pain.  Her husband was recently diagnosed with the flu.  She notes that she has pain in the central chest worse with deep inspiration and cough.  She notes some mild wheezing and shortness of breath which improved significantly with albuterol inhaler and nebulizer.  She denies a history of asthma but he has albuterol leftover from previous episodes of bronchitis.  She is tried some over-the-counter medications that helped.  She notes the cough is productive and interferes with sleep.  She denies exertional chest pain or palpitations.  Denies leg swelling or recent periods of immobility   Past Medical History:  Diagnosis Date  . 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 Sore Throat - Dr. Dirk DressKathy Teasdall ENT 11/23/2011   Past Surgical History:  Procedure Laterality Date  . CESAREAN SECTION    . TONSILLECTOMY     Social History   Tobacco Use  . Smoking status: Former Games developermoker  . Smokeless tobacco: Never Used  Substance Use Topics  . Alcohol use: No   family history is not on file.  ROS as above:  Medications: Current Outpatient Medications  Medication Sig Dispense Refill  . albuterol (PROVENTIL HFA;VENTOLIN HFA) 108 (90 Base) MCG/ACT inhaler Inhale 1-2 puffs into the lungs every 6 (six) hours as needed for wheezing or shortness of breath. 1 Inhaler 1  . albuterol (PROVENTIL) (2.5 MG/3ML) 0.083% nebulizer solution Take 3 mLs (2.5 mg total) by nebulization every 6 (six) hours as needed for wheezing or shortness of breath. 150 mL 1  . EPIPEN 2-PAK 0.3 MG/0.3ML SOAJ injection 1 (ONE) PRE-FILLED PEN SYRINGE  AS NEEDED FOR EMERGENCY USE  0  . ipratropium (ATROVENT) 0.06 % nasal spray Place 2 sprays into both nostrils 4 (four) times daily. For up to 1 week 15 mL 0  . azithromycin (ZITHROMAX) 250 MG tablet Take 1 tablet (250 mg total) by mouth daily. Take first 2 tablets together, then 1 every day until finished. 6 tablet 0  . benzonatate (TESSALON) 200 MG capsule Take 1 capsule (200 mg total) by mouth 3 (three) times daily as needed for cough. 45 capsule 0  . guaiFENesin-codeine 100-10 MG/5ML syrup Take 5 mLs by mouth at bedtime as needed for cough. 120 mL 0  . predniSONE (DELTASONE) 10 MG tablet Take 3 tablets (30 mg total) by mouth daily with breakfast. 15 tablet 0   No current facility-administered medications for this visit.    Allergies  Allergen Reactions  . Sulfa Antibiotics Hives    Red splotches all over body    Health Maintenance Health Maintenance  Topic Date Due  . MAMMOGRAM  10/16/2018 (Originally 04/25/2015)  . PAP SMEAR  10/16/2018 (Originally 07/18/1979)  . COLONOSCOPY  10/16/2018 (Originally 07/17/2008)  . TETANUS/TDAP  10/16/2018 (Originally 07/17/1977)  . INFLUENZA VACCINE  10/16/2038 (Originally 09/14/2016)  . Hepatitis C Screening  Completed  . HIV Screening  Completed     Exam:  BP (!) 153/98   Pulse 98   Temp 98.9 F (37.2 C) (Oral)   Wt 106 lb (48.1 kg)   BMI 20.70 kg/m  Gen: Well NAD HEENT: EOMI,  MMM Lungs: Normal work of breathing.  Coarse  breath sounds with prolonged expiratory phase bilaterally Heart: RRR no MRG Abd: NABS, Soft. Nondistended, Nontender Exts: Brisk capillary refill, warm and well perfused.   12 lead EKG  NSR 86 bpm. No ST elevation or depression. Normal T waves. No q waves. Normal EKG.   My personal interpretation of two-view chest x-ray images reveals bronchitic changes with no infiltrate or other acute findings. Awaiting radiology review.    Assessment and Plan: 57 y.o. female with cough and chest pain likely due to bronchitis.  Treat  with prednisone Tessalon and codeine.  Use albuterol as well as needed.  Backup azithromycin if worsening.  Follow-up with PCP in the near future to address elevated blood pressure today.     Orders Placed This Encounter  Procedures  . DG Chest 2 View    Order Specific Question:   Reason for exam:    Answer:   Cough, assess intra-thoracic pathology    Order Specific Question:   Is the patient pregnant?    Answer:   No    Order Specific Question:   Preferred imaging location?    Answer:   [facility]   Meds ordered this encounter  Medications  . azithromycin (ZITHROMAX) 250 MG tablet    Sig: Take 1 tablet (250 mg total) by mouth daily. Take first 2 tablets together, then 1 every day until finished.    Dispense:  6 tablet    Refill:  0  . benzonatate (TESSALON) 200 MG capsule    Sig: Take 1 capsule (200 mg total) by mouth 3 (three) times daily as needed for cough.    Dispense:  45 capsule    Refill:  0  . guaiFENesin-codeine 100-10 MG/5ML syrup    Sig: Take 5 mLs by mouth at bedtime as needed for cough.    Dispense:  120 mL    Refill:  0  . predniSONE (DELTASONE) 10 MG tablet    Sig: Take 3 tablets (30 mg total) by mouth daily with breakfast.    Dispense:  15 tablet    Refill:  0     Discussed warning signs or symptoms. Please see discharge instructions. Patient expresses understanding.

## 2017-05-08 NOTE — Patient Instructions (Signed)
Thank you for coming in today. Call or go to the emergency room if you get worse, have trouble breathing, have chest pains, or palpitations.  Use tessalon pearles for cough as needed.  Use codeine cough medicine at bedtime as needed.  Take azithromycin and prednisone.  Recheck if not better.    Acute Bronchitis, Adult Acute bronchitis is sudden (acute) swelling of the air tubes (bronchi) in the lungs. Acute bronchitis causes these tubes to fill with mucus, which can make it hard to breathe. It can also cause coughing or wheezing. In adults, acute bronchitis usually goes away within 2 weeks. A cough caused by bronchitis may last up to 3 weeks. Smoking, allergies, and asthma can make the condition worse. Repeated episodes of bronchitis may cause further lung problems, such as chronic obstructive pulmonary disease (COPD). What are the causes? This condition can be caused by germs and by substances that irritate the lungs, including:  Cold and flu viruses. This condition is most often caused by the same virus that causes a cold.  Bacteria.  Exposure to tobacco smoke, dust, fumes, and air pollution.  What increases the risk? This condition is more likely to develop in people who:  Have close contact with someone with acute bronchitis.  Are exposed to lung irritants, such as tobacco smoke, dust, fumes, and vapors.  Have a weak immune system.  Have a respiratory condition such as asthma.  What are the signs or symptoms? Symptoms of this condition include:  A cough.  Coughing up clear, yellow, or green mucus.  Wheezing.  Chest congestion.  Shortness of breath.  A fever.  Body aches.  Chills.  A sore throat.  How is this diagnosed? This condition is usually diagnosed with a physical exam. During the exam, your health care provider may order tests, such as chest X-rays, to rule out other conditions. He or she may also:  Test a sample of your mucus for bacterial  infection.  Check the level of oxygen in your blood. This is done to check for pneumonia.  Do a chest X-ray or lung function testing to rule out pneumonia and other conditions.  Perform blood tests.  Your health care provider will also ask about your symptoms and medical history. How is this treated? Most cases of acute bronchitis clear up over time without treatment. Your health care provider may recommend:  Drinking more fluids. Drinking more makes your mucus thinner, which may make it easier to breathe.  Taking a medicine for a fever or cough.  Taking an antibiotic medicine.  Using an inhaler to help improve shortness of breath and to control a cough.  Using a cool mist vaporizer or humidifier to make it easier to breathe.  Follow these instructions at home: Medicines  Take over-the-counter and prescription medicines only as told by your health care provider.  If you were prescribed an antibiotic, take it as told by your health care provider. Do not stop taking the antibiotic even if you start to feel better. General instructions  Get plenty of rest.  Drink enough fluids to keep your urine clear or pale yellow.  Avoid smoking and secondhand smoke. Exposure to cigarette smoke or irritating chemicals will make bronchitis worse. If you smoke and you need help quitting, ask your health care provider. Quitting smoking will help your lungs heal faster.  Use an inhaler, cool mist vaporizer, or humidifier as told by your health care provider.  Keep all follow-up visits as told by your health  care provider. This is important. How is this prevented? To lower your risk of getting this condition again:  Wash your hands often with soap and water. If soap and water are not available, use hand sanitizer.  Avoid contact with people who have cold symptoms.  Try not to touch your hands to your mouth, nose, or eyes.  Make sure to get the flu shot every year.  Contact a health care  provider if:  Your symptoms do not improve in 2 weeks of treatment. Get help right away if:  You cough up blood.  You have chest pain.  You have severe shortness of breath.  You become dehydrated.  You faint or keep feeling like you are going to faint.  You keep vomiting.  You have a severe headache.  Your fever or chills gets worse. This information is not intended to replace advice given to you by your health care provider. Make sure you discuss any questions you have with your health care provider. Document Released: 03/10/2004 Document Revised: 08/26/2015 Document Reviewed: 07/22/2015 Elsevier Interactive Patient Education  [employer].

## 2017-05-15 NOTE — Addendum Note (Signed)
Addended by: Collie SiadICHARDSON, Kaelene Elliston M on: 05/15/2017 03:06 PM   Modules accepted: Orders

## 2017-07-04 ENCOUNTER — Ambulatory Visit (INDEPENDENT_AMBULATORY_CARE_PROVIDER_SITE_OTHER): Payer: Commercial Managed Care - PPO | Admitting: Osteopathic Medicine

## 2017-07-04 ENCOUNTER — Encounter: Payer: Self-pay | Admitting: Osteopathic Medicine

## 2017-07-04 VITALS — BP 133/77 | HR 70 | Temp 98.0°F | Ht 60.0 in | Wt 105.1 lb

## 2017-07-04 DIAGNOSIS — Z0189 Encounter for other specified special examinations: Secondary | ICD-10-CM

## 2017-07-04 DIAGNOSIS — R6889 Other general symptoms and signs: Secondary | ICD-10-CM

## 2017-07-04 DIAGNOSIS — Z008 Encounter for other general examination: Secondary | ICD-10-CM

## 2017-07-04 NOTE — Patient Instructions (Addendum)
   Will fill out paperwork once I have lab results  You will need to confirm within one week that it was received   For circulation, the best thing is exercise. Otherwise, some people are unfortunately just cold-natured and there is not a medicine we can give to make you feel warmer.

## 2017-07-04 NOTE — Progress Notes (Signed)
HPI: Elizabeth Pruitt is a 57 y.o. 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 Dirk Dress ENT (11/23/2011).  she presents to [facility] today, 07/04/17,  for chief complaint of:  Biometric screening Cold intolerance  Needs form for biometrics screening. Fasting today.  Inquires about cold intolerance and circulation issues -if there is any medicine I can give her to try to improve circulation and warmth. Has been tested for anemia and thyroid issues, does not want labs repeated now.    Past medical history, surgical history, and family history reviewed.  Current medication list and allergy/intolerance information reviewed.   (See remainder of HPI, ROS, Phys Exam below)    ASSESSMENT/PLAN:   Encounter for biometric screening - Plan: Lipid panel, Glucose  Cold intolerance - advised I would not prescribe thyroid meds without a medical indication to do so. Pt declines further labs today. Will revisit as needed.      Patient Instructions   Will fill out paperwork once I have lab results  You will need to confirm within one week that it was received   For circulation, the best thing is exercise. Otherwise, some people are unfortunately just cold-natured and there is not a medicine we can give to make you feel warmer.    Follow-up plan: Return for annual check-up when due .     ############################################ ############################################ ############################################ ############################################    Outpatient Encounter Medications as of 07/04/2017  Medication Sig  . albuterol (PROVENTIL HFA;VENTOLIN HFA) 108 (90 Base) MCG/ACT inhaler Inhale 1-2 puffs into the lungs every 6 (six) hours as needed for wheezing or shortness of breath.  Marland Kitchen albuterol (PROVENTIL)  (2.5 MG/3ML) 0.083% nebulizer solution Take 3 mLs (2.5 mg total) by nebulization every 6 (six) hours as needed for wheezing or shortness of breath.  . EPIPEN 2-PAK 0.3 MG/0.3ML SOAJ injection 1 (ONE) PRE-FILLED PEN SYRINGE AS NEEDED FOR EMERGENCY USE  . ipratropium (ATROVENT) 0.06 % nasal spray Place 2 sprays into both nostrils 4 (four) times daily. For up to 1 week  . azithromycin (ZITHROMAX) 250 MG tablet Take 1 tablet (250 mg total) by mouth daily. Take first 2 tablets together, then 1 every day until finished. (Patient not taking: Reported on 07/04/2017)  . benzonatate (TESSALON) 200 MG capsule Take 1 capsule (200 mg total) by mouth 3 (three) times daily as needed for cough. (Patient not taking: Reported on 07/04/2017)  . guaiFENesin-codeine 100-10 MG/5ML syrup Take 5 mLs by mouth at bedtime as needed for cough. (Patient not taking: Reported on 07/04/2017)  . predniSONE (DELTASONE) 10 MG tablet Take 3 tablets (30 mg total) by mouth daily with breakfast. (Patient not taking: Reported on 07/04/2017)   No facility-administered encounter medications on file as of 07/04/2017.    Allergies  Allergen Reactions  . Sulfa Antibiotics Hives    Red splotches all over body      Review of Systems:  Constitutional: No recent illness  Cardiac: No  chest pain, No  pressure  Respiratory:  No  shortness of breath   Exam:  BP 133/77 (BP Location: Left Arm, Patient Position: Sitting, Cuff Size: Small)   Pulse 70   Temp 98 F (36.7 C) (Oral)   Ht 5' (1.524 m)   Wt 105 lb 1.6 oz (47.7 kg)   BMI 20.53 kg/m   Constitutional: VS see above. General Appearance: alert, well-developed,  well-nourished, NAD  Eyes: Normal lids and conjunctive, non-icteric sclera  Ears, Nose, Mouth, Throat: MMM, Normal external inspection ears/nares/mouth/lips/gums.  Neck: No masses, trachea midline.   Respiratory: Normal respiratory effort.  Musculoskeletal: Gait normal. Symmetric and independent movement of all  extremities  Neurological: Normal balance/coordination. No tremor.  Skin: warm, dry, intact.   Psychiatric: Normal judgment/insight. Normal mood and affect. Oriented x3.   Visit summary with medication list and pertinent instructions was printed for patient to review, advised to alert [place] if any changes needed. All questions at time of visit were answered - patient instructed to contact office with any additional concerns. ER/RTC precautions were reviewed with the patient and understanding verbalized.   Follow-up plan: Return for annual check-up when due .  Note: Total time spent 15 minutes, greater than 50% of the visit was spent face-to-face counseling and coordinating care for the following: The primary encounter diagnosis was Encounter for biometric screening. A diagnosis of Cold intolerance was also pertinent to this visit.Marland Kitchen  Please note: voice recognition software was used to produce this document, and typos may escape review. Please contact Dr. Lyn Hollingshead for any needed clarifications.

## 2017-07-05 LAB — LIPID PANEL
Cholesterol: 224 mg/dL — ABNORMAL HIGH (ref <200)
HDL: 81 mg/dL (ref >50)
LDL Cholesterol (Calc): 127 mg/dL (calc) — ABNORMAL HIGH
NON-HDL CHOLESTEROL (CALC): 143 mg/dL — AB (ref <130)
Total CHOL/HDL Ratio: 2.8 (calc) (ref <5.0)
Triglycerides: 72 mg/dL (ref <150)

## 2017-07-05 LAB — GLUCOSE, RANDOM: Glucose, Bld: 95 mg/dL (ref 65–99)

## 2017-08-28 ENCOUNTER — Telehealth: Payer: Self-pay | Admitting: Osteopathic Medicine

## 2017-08-28 NOTE — Telephone Encounter (Signed)
Pt requested to change her PCP. She gave no reason for the change. Will be setting appt with charley in the future.

## 2017-09-01 ENCOUNTER — Encounter: Payer: Self-pay | Admitting: Family Medicine

## 2017-09-01 ENCOUNTER — Ambulatory Visit (INDEPENDENT_AMBULATORY_CARE_PROVIDER_SITE_OTHER): Payer: Commercial Managed Care - PPO | Admitting: Family Medicine

## 2017-09-01 VITALS — BP 129/79 | HR 67 | Ht 60.0 in | Wt 107.0 lb

## 2017-09-01 DIAGNOSIS — J0110 Acute frontal sinusitis, unspecified: Secondary | ICD-10-CM

## 2017-09-01 MED ORDER — DOXYCYCLINE HYCLATE 100 MG PO TABS: 100.0000 mg | ORAL_TABLET | Freq: Two times a day (BID) | ORAL | 0 refills | Status: DC

## 2017-09-01 NOTE — Patient Instructions (Signed)

## 2017-09-01 NOTE — Progress Notes (Signed)
   Subjective:    Patient ID: Elizabeth Pruitt, female    DOB: 11/20/1960, 57 y.o.   MRN: [id number]  HPI 57 year old female is here today complaining of 2 weeks of sinus symptoms.  She says it got worse about 5 days ago.  She has had sinus pressure with thick  Brown and greeen mucus.  She has not had a fever but just feels like she has had some chills.  She now has a severe frontal HA.  She has had a very sore throat as well.  She feels very tired and run down.     Review of Systems     Objective:   Physical Exam  Constitutional: She is oriented to person, place, and time. She appears well-developed and well-nourished.  HENT:  Head: Normocephalic and atraumatic.  Right Ear: External ear normal.  Left Ear: External ear normal.  Nose: Nose normal.  Mouth/Throat: Oropharynx is clear and moist.  TMs and canals are clear.   Eyes: Pupils are equal, round, and reactive to light. Conjunctivae and EOM are normal.  Neck: Neck supple. No thyromegaly present.  Cardiovascular: Normal rate, regular rhythm and normal heart sounds.  Pulmonary/Chest: Effort normal and breath sounds normal. She has no wheezes.  Lymphadenopathy:    She has no cervical adenopathy.  Neurological: She is alert and oriented to person, place, and time.  Skin: Skin is warm and dry.  Psychiatric: She has a normal mood and affect.       Assessment & Plan:  Acute sinusitis -   Call if not better in one week. Start doxycycline.  Okay to continue symptomatic care.  Additional handout provided.

## 2018-01-16 ENCOUNTER — Other Ambulatory Visit: Payer: Self-pay

## 2018-01-16 ENCOUNTER — Emergency Department
Admission: EM | Admit: 2018-01-16 | Discharge: 2018-01-16 | Disposition: A | Discharge status: Home / Self Care | Payer: Commercial Managed Care - PPO | Source: Home / Self Care | Attending: Family Medicine | Admitting: Family Medicine

## 2018-01-16 DIAGNOSIS — J069 Acute upper respiratory infection, unspecified: Secondary | ICD-10-CM | POA: Diagnosis not present

## 2018-01-16 DIAGNOSIS — B9789 Other viral agents as the cause of diseases classified elsewhere: Secondary | ICD-10-CM

## 2018-01-16 DIAGNOSIS — J0120 Acute ethmoidal sinusitis, unspecified: Secondary | ICD-10-CM

## 2018-01-16 MED ORDER — DOXYCYCLINE HYCLATE 100 MG PO TABS: 100.0000 mg | ORAL_TABLET | Freq: Two times a day (BID) | ORAL | 0 refills | Status: DC

## 2018-01-16 NOTE — ED Triage Notes (Signed)
Started 1 week ago with sore throat, nasal congestion.  Feels like it is in the chest now.  Productive cough with green phlegm.

## 2018-01-16 NOTE — Discharge Instructions (Addendum)
Take plain guaifenesin (1200mg extended release tabs such as Mucinex) twice daily, with plenty of water, for cough and congestion.  May add Pseudoephedrine (30mg, one or two every 4 to 6 hours) for sinus congestion.  Get adequate rest.   °May use Afrin nasal spray (or generic oxymetazoline) each morning for about 5 days and then discontinue.  Also recommend using saline nasal spray several times daily and saline nasal irrigation (AYR is a common brand).  Use Flonase nasal spray each morning after using Afrin nasal spray and saline nasal irrigation. °Try warm salt water gargles for sore throat.  °Stop all antihistamines for now, and other non-prescription cough/cold preparations. °May take Ibuprofen 200mg, 4 tabs every 8 hours with food for headache, fever, etc. °  °  °

## 2018-01-16 NOTE — ED Provider Notes (Signed)
[facility]    CSN: [id number] Arrival date & time: 01/16/18  1513     History   Chief Complaint Chief Complaint  Patient presents with  . Facial Pain  . Nasal Congestion    HPI Elizabeth Pruitt is a 57 y.o. female.   One week ago patient developed typical cold-like symptoms developing over several days, including mild sore throat, sinus congestion, myalgias, and fatigue.  She developed a cough two days ago.  She has a past history of sinusitis and pneumonia.                                                                                                                                                                                                                                                      The history is provided by the patient.    Past Medical History:  Diagnosis Date  . 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 Sore Throat - Dr. Dirk Dress ENT 11/23/2011    Patient Active Problem List   Diagnosis Date Noted  . Raynaud phenomenon 04/09/2015  . GAD (generalized anxiety disorder) 11/24/2014  . Ganglion cyst 11/24/2014  . Vasomotor flushing 07/30/2013  . Muscle spasm 01/06/2012  . Neck pain 01/06/2012  . Bilateral hand numbness 01/06/2012  . Sore Throat - Dr. Dirk Dress ENT 11/23/2011  . 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 Dysphagia 11/09/2011  . 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 CESAREAN SECTION    . TONSILLECTOMY      OB History   None      Home Medications    Prior to Admission medications   Medication Sig Start Date End Date Taking? Authorizing Provider  albuterol (PROVENTIL HFA;VENTOLIN HFA) 108 (90 Base) MCG/ACT inhaler Inhale 1-2 puffs into the lungs every 6 (six) hours as needed for wheezing or shortness of breath. 03/13/17   Sunnie Nielsen, DO  albuterol (PROVENTIL)  (2.5 MG/3ML) 0.083% nebulizer solution Take 3 mLs (2.5 mg total) by nebulization every 6 (six) hours as needed for wheezing or shortness of breath. 04/18/17   Sunnie Nielsen, DO  doxycycline (VIBRA-TABS) 100 MG tablet Take 1 tablet (100 mg total) by mouth 2 (two)  times daily. Take with food 01/16/18   Lattie Haw, MD  EPIPEN 2-PAK 0.3 MG/0.3ML SOAJ injection 1 (ONE) PRE-FILLED PEN SYRINGE AS NEEDED FOR EMERGENCY USE 08/19/16   [provider]  ipratropium (ATROVENT) 0.06 % nasal spray Place 2 sprays into both nostrils 4 (four) times daily. For up to 1 week 04/18/17   Sunnie Nielsen, DO    Family History History reviewed. No pertinent family history.  Social History Social History   Tobacco Use  . Smoking status: Former Games developer  . Smokeless tobacco: Never Used  Substance Use Topics  . Alcohol use: No  . Drug use: No     Allergies   Sulfa antibiotics   Review of Systems Review of Systems + sore throat + cough No pleuritic pain No wheezing + nasal congestion + post-nasal drainage + sinus pain/pressure No itchy/red eyes No earache No hemoptysis No SOB No fever, + chills No nausea No vomiting No abdominal pain No diarrhea No urinary symptoms No skin rash + fatigue No myalgias + headache Used OTC meds without relief   Physical Exam Triage Vital Signs ED Triage Vitals  Enc Vitals Group     BP 01/16/18 1648 (!) 141/89     Pulse Rate 01/16/18 1648 78     Resp 01/16/18 1648 18     Temp 01/16/18 1648 97.7 F (36.5 C)     Temp Source 01/16/18 1648 Oral     SpO2 01/16/18 1648 99 %     Weight 01/16/18 1649 114 lb (51.7 kg)     Height 01/16/18 1649 5\' 1"  (1.549 m)     Head Circumference --      Peak Flow --      Pain Score 01/16/18 1648 8     Pain Loc --      Pain Edu? --      Excl. in GC? --    No data found.  Updated Vital Signs BP (!) 141/89 (BP Location: Right Arm)   Pulse 78   Temp 97.7 F (36.5 C) (Oral)   Resp 18   Ht 5\' 1"  (1.549 m)    Wt 51.7 kg   SpO2 99%   BMI 21.54 kg/m   Visual Acuity Right Eye Distance:   Left Eye Distance:   Bilateral Distance:    Right Eye Near:   Left Eye Near:    Bilateral Near:     Physical Exam Nursing notes and Vital Signs reviewed. Appearance:  Patient appears stated age, and in no acute distress Eyes:  Pupils are equal, round, and reactive to light and accomodation.  Extraocular movement is intact.  Conjunctivae are not inflamed  Ears:  Canals normal.  Tympanic membranes normal.  Nose:  Congested turbinates.  Tenderness over maxillary sinuses and over bridge of nose. Pharynx:  Normal Neck:  Supple.  Enlarged posterior/lateral nodes are palpated bilaterally, tender to palpation on the left.   Lungs:  Clear to auscultation.  Breath sounds are equal.  Moving air well. Heart:  Regular rate and rhythm without murmurs, rubs, or gallops.  Abdomen:  Nontender without masses or hepatosplenomegaly.  Bowel sounds are present.  No CVA or flank tenderness.  Extremities:  No edema.  Skin:  No rash present.    UC Treatments / Results  Labs (all labs ordered are listed, but only abnormal results are displayed) Labs Reviewed - No data to display  EKG None  Radiology No results found.  Procedures Procedures (including critical care time)  Medications  Ordered in UC Medications - No data to display  Initial Impression / Assessment and Plan / UC Course  I have reviewed the triage vital signs and the nursing notes.  Pertinent labs & imaging results that were available during my care of the patient were reviewed by me and considered in my medical decision making (see chart for details).    Begin doxycycline 100mg  BID. May continue cough suppressant at bedtime. Followup with Family Doctor if not improved in about 10 days.   Final Clinical Impressions(s) / UC Diagnoses   Final diagnoses:  Viral URI with cough  Acute ethmoidal sinusitis, recurrence not specified     Discharge  Instructions     Take plain guaifenesin (1200mg  extended release tabs such as Mucinex) twice daily, with plenty of water, for cough and congestion.  May add Pseudoephedrine (30mg , one or two every 4 to 6 hours) for sinus congestion.  Get adequate rest.   May use Afrin nasal spray (or generic oxymetazoline) each morning for about 5 days and then discontinue.  Also recommend using saline nasal spray several times daily and saline nasal irrigation (AYR is a common brand).  Use Flonase nasal spray each morning after using Afrin nasal spray and saline nasal irrigation. Try warm salt water gargles for sore throat.  Stop all antihistamines for now, and other non-prescription cough/cold preparations. May take Ibuprofen 200mg , 4 tabs every 8 hours with food for headache, fever, etc.      ED Prescriptions    Medication Sig Dispense Auth. Provider   doxycycline (VIBRA-TABS) 100 MG tablet Take 1 tablet (100 mg total) by mouth 2 (two) times daily. Take with food 20 tablet Lattie HawBeese, Lisbet Busker A, MD        Lattie HawBeese, Kalany Diekmann A, MD 01/17/18 1556

## 2018-05-02 ENCOUNTER — Encounter: Payer: Self-pay | Admitting: Osteopathic Medicine

## 2018-05-02 ENCOUNTER — Other Ambulatory Visit: Payer: Self-pay

## 2018-05-02 ENCOUNTER — Ambulatory Visit (INDEPENDENT_AMBULATORY_CARE_PROVIDER_SITE_OTHER): Payer: Commercial Managed Care - PPO | Admitting: Osteopathic Medicine

## 2018-05-02 VITALS — BP 124/76 | HR 84 | Temp 97.9°F | Wt 108.2 lb

## 2018-05-02 DIAGNOSIS — J019 Acute sinusitis, unspecified: Secondary | ICD-10-CM | POA: Diagnosis not present

## 2018-05-02 MED ORDER — DOXYCYCLINE HYCLATE 100 MG PO TABS: 100.0000 mg | ORAL_TABLET | Freq: Two times a day (BID) | ORAL | 0 refills | Status: DC

## 2018-05-02 NOTE — Progress Notes (Signed)
HPI: Elizabeth Pruitt is a 58 y.o. 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 Dirk Dress ENT (11/23/2011).  she presents to [facility] today, 05/02/18,  for chief complaint of: Sinus problem   . Context: allergies, thinks have led to sinus infection . Location/Quality: sinuses runny and pressure, sore throat  . Duration: 4-5 days . Allegra nor helping       Past medical, surgical, social and family history reviewed and updated as necessary.   Current medication list and allergy/intolerance information reviewed:    Current Outpatient Medications  Medication Sig Dispense Refill  . albuterol (PROVENTIL HFA;VENTOLIN HFA) 108 (90 Base) MCG/ACT inhaler Inhale 1-2 puffs into the lungs every 6 (six) hours as needed for wheezing or shortness of breath. 1 Inhaler 1  . albuterol (PROVENTIL) (2.5 MG/3ML) 0.083% nebulizer solution Take 3 mLs (2.5 mg total) by nebulization every 6 (six) hours as needed for wheezing or shortness of breath. 150 mL 1  . EPIPEN 2-PAK 0.3 MG/0.3ML SOAJ injection 1 (ONE) PRE-FILLED PEN SYRINGE AS NEEDED FOR EMERGENCY USE  0  . ipratropium (ATROVENT) 0.06 % nasal spray Place 2 sprays into both nostrils 4 (four) times daily. For up to 1 week 15 mL 0  . doxycycline (VIBRA-TABS) 100 MG tablet Take 1 tablet (100 mg total) by mouth 2 (two) times daily. Take with food 14 tablet 0   No current facility-administered medications for this visit.     Allergies  Allergen Reactions  . Sulfa Antibiotics Hives    Red splotches all over body      Review of Systems:  Constitutional:  No  fever, no chills, +recent illness, No unintentional weight changes. No significant fatigue.   HEENT: No  headache, no vision change, no hearing change, +sore throat, +sinus pressure  Cardiac: No  chest pain, No  pressure, No  palpitations  Respiratory:  No  shortness of breath. No  Cough  Gastrointestinal: No  abdominal pain, No  nausea, No  vomiting,  No  blood in stool, No  diarrhea  Musculoskeletal: No new myalgia/arthralgia  Skin: No  Rash  Neurologic: No  weakness, No  dizziness  Exam:  BP 124/76 (BP Location: Left Arm, Patient Position: Sitting, Cuff Size: Normal)   Pulse 84   Temp 97.9 F (36.6 C) (Oral)   Wt 108 lb 3.2 oz (49.1 kg)   BMI 20.44 kg/m   Constitutional: VS see above. General Appearance: alert, well-developed, well-nourished, NAD  Eyes: Normal lids and conjunctive, non-icteric sclera  Ears, Nose, Mouth, Throat: MMM, Normal external inspection ears/nares/mouth/lips/gums. TM normal bilaterally. Pharynx/tonsils +erythema, no exudate. Nasal mucosa congested.   Neck: No masses, trachea midline. No tenderness/mass appreciated. No lymphadenopathy  Respiratory: Normal respiratory effort. no wheeze, no rhonchi, no rales  Cardiovascular: S1/S2 normal, no murmur, no rub/gallop auscultated. RRR. No lower extremity edema.   Musculoskeletal: Gait normal.   Neurological: Normal balance/coordination. No tremor.   Skin: warm, dry, intact.   Psychiatric: Normal judgment/insight. Normal mood and affect.     ASSESSMENT/PLAN: The encounter diagnosis was Acute non-recurrent sinusitis, unspecified location.   Meds ordered this encounter  Medications  . DISCONTD: doxycycline (VIBRA-TABS) 100 MG tablet    Sig: Take 1 tablet (100 mg total) by mouth 2 (two) times daily. Take with food    Dispense:  14 tablet    Refill:  0  .  doxycycline (VIBRA-TABS) 100 MG tablet    Sig: Take 1 tablet (100 mg total) by mouth 2 (two) times daily. Take with food    Dispense:  14 tablet    Refill:  0      Patient Instructions  Medications & Home Remedies for Upper Respiratory Illness   Note: the following list assumes no pregnancy, normal liver & kidney function and no other drug interactions. Dr.  Lyn Hollingshead has highlighted medications which are safe for you to use, but these may not be appropriate for everyone. Always ask a pharmacist or qualified medical provider if you have any questions!    Aches/Pains, Fever, Headache OTC Acetaminophen (Tylenol) 500 mg tablets - take max 2 tablets (1000 mg) every 6 hours (4 times per day)  OTC Ibuprofen (Motrin) 200 mg tablets - take max 4 tablets (800 mg) every 6 hours   Sinus Congestion OTC Nasal Saline if desired to rinse OTC Oxymetolazone (Afrin, others) sparing use due to rebound congestion, NEVER use in kids OTC Phenylephrine (Sudafed) 10 mg tablets every 4 hours (or the 12-hour formulation)* OTC Diphenhydramine (Benadryl) 25 mg tablets - take max 2 tablets every 4 hours   Cough & Sore Throat OTC Dextromethorphan (Robitussin, others) - cough suppressant OTC Guaifenesin (Robitussin, Mucinex, others) - expectorant (helps cough up mucus) (Dextromethorphan and Guaifenesin also come in a combination tablet/syrup) OTC Lozenges w/ Benzocaine + Menthol (Cepacol) Honey - as much as you want! Teas which "coat the throat" - look for ingredients Elm Bark, Licorice Root, Marshmallow Root   Other Prescription Antibiotics if these are necessary for bacterial infection - take ALL, even if you're feeling better  OTC Zinc Lozenges within 24 hours of symptoms onset - mixed evidence this shortens the duration of the common cold Don't waste your money on Vitamin C or Echinacea in acute illness - it's already too late!    *Caution in patients with high blood pressure         Visit summary with medication list and pertinent instructions was printed for patient to review. All questions at time of visit were answered - patient instructed to contact office with any additional concerns or updates. ER/RTC precautions were reviewed with the patient.   Follow-up plan: Return in about 6 months (around 11/02/2018) for annual physical.   Please note: voice  recognition software was used to produce this document, and typos may escape review. Please contact Dr. Lyn Hollingshead for any needed clarifications.

## 2018-05-02 NOTE — Patient Instructions (Addendum)
Medications & Home Remedies for Upper Respiratory Illness   Note: the following list assumes no pregnancy, normal liver & kidney function and no other drug interactions. Dr. Abhijay Morriss has highlighted medications which are safe for you to use, but these may not be appropriate for everyone. Always ask a pharmacist or qualified medical provider if you have any questions!    Aches/Pains, Fever, Headache OTC Acetaminophen (Tylenol) 500 mg tablets - take max 2 tablets (1000 mg) every 6 hours (4 times per day)  OTC Ibuprofen (Motrin) 200 mg tablets - take max 4 tablets (800 mg) every 6 hours*   Sinus Congestion OTC Nasal Saline if desired to rinse OTC Oxymetolazone (Afrin, others) sparing use due to rebound congestion, NEVER use in kids OTC Phenylephrine (Sudafed) 10 mg tablets every 4 hours (or the 12-hour formulation)* OTC Diphenhydramine (Benadryl) 25 mg tablets - take max 2 tablets every 4 hours   Cough & Sore Throat OTC Dextromethorphan (Robitussin, others) - cough suppressant OTC Guaifenesin (Robitussin, Mucinex, others) - expectorant (helps cough up mucus) (Dextromethorphan and Guaifenesin also come in a combination tablet/syrup) OTC Lozenges w/ Benzocaine + Menthol (Cepacol) Honey - as much as you want! Teas which "coat the throat" - look for ingredients Elm Bark, Licorice Root, Marshmallow Root   Other Prescription Antibiotics if these are necessary for bacterial infection - take ALL, even if you're feeling better  OTC Zinc Lozenges within 24 hours of symptoms onset - mixed evidence this shortens the duration of the common cold Don't waste your money on Vitamin C or Echinacea in acute illness - it's already too late!    *Caution in patients with high blood pressure    

## 2018-05-10 ENCOUNTER — Ambulatory Visit (INDEPENDENT_AMBULATORY_CARE_PROVIDER_SITE_OTHER): Payer: Commercial Managed Care - PPO

## 2018-05-10 ENCOUNTER — Ambulatory Visit (INDEPENDENT_AMBULATORY_CARE_PROVIDER_SITE_OTHER): Payer: Commercial Managed Care - PPO | Admitting: Osteopathic Medicine

## 2018-05-10 ENCOUNTER — Other Ambulatory Visit: Payer: Self-pay

## 2018-05-10 VITALS — BP 128/89 | HR 82 | Temp 98.1°F | Wt 106.9 lb

## 2018-05-10 DIAGNOSIS — R059 Cough, unspecified: Secondary | ICD-10-CM

## 2018-05-10 DIAGNOSIS — R05 Cough: Secondary | ICD-10-CM | POA: Diagnosis not present

## 2018-05-10 MED ORDER — GUAIFENESIN-CODEINE 100-10 MG/5ML PO SYRP: 5.0000 mL | ORAL_SOLUTION | Freq: Three times a day (TID) | ORAL | 0 refills | Status: DC | PRN

## 2018-05-10 MED ORDER — ALBUTEROL SULFATE HFA 108 (90 BASE) MCG/ACT IN AERS: 1.0000 | INHALATION_SPRAY | Freq: Four times a day (QID) | RESPIRATORY_TRACT | 1 refills | Status: DC | PRN

## 2018-05-10 MED ORDER — PREDNISONE 10 MG (48) PO TBPK: ORAL_TABLET | Freq: Every day | ORAL | 0 refills | Status: DC

## 2018-05-10 NOTE — Patient Instructions (Addendum)
If the antibiotics made no difference, likely this was always a viral infection or allergies. I do not think more antibiotics are necessary. You are more likely suffering from post-viral cough syndrome, which should improve with steroids, inhaler, and cough medicine (which I sent to the pharmacy). I also The cough can sometimes last several weeks but should slowly improve. Please come see [place] again if fever or shortness of breath, as this may be signs of pneumonia, which is a possible complication but is not likely.   Viral Respiratory Infection A respiratory infection is an illness that affects part of the respiratory system, such as the lungs, nose, or throat. A respiratory infection that is caused by a virus is called a viral respiratory infection. Common types of viral respiratory infections include:  A cold.  The flu (influenza).  A respiratory syncytial virus (RSV) infection. What are the causes? This condition is caused by a virus. What are the signs or symptoms? Symptoms of this condition include:  A stuffy or runny nose.  Yellow or green nasal discharge.  A cough.  Sneezing.  Fatigue.  Achy muscles.  A sore throat.  Sweating or chills.  A fever.  A headache. How is this diagnosed? This condition may be diagnosed based on:  Your symptoms.  A physical exam. How is this treated? This condition may be treated with medicines, such as:  Expectorants. These make it easier to cough up mucus.  Decongestant nasal sprays.  Acetaminophen or NSAIDs to relieve fever and pain. Antibiotic medicines are not prescribed for viral infections. This is because antibiotics are designed to kill bacteria. They are not effective against viruses. Follow these instructions at home:  Managing pain and congestion  Take over-the-counter and prescription medicines only as told by your health care provider.  If you have a sore throat, gargle with a salt-water mixture 3-4 times a day or  as needed. To make a salt-water mixture, completely dissolve -1 tsp of salt in 1 cup of warm water.  Use nose drops made from salt water to ease congestion and soften raw skin around your nose.  Drink enough fluid to keep your urine pale yellow. This helps prevent dehydration and helps loosen up mucus. General instructions  Rest as much as possible.  Do not drink alcohol.  Do not use any products that contain nicotine or tobacco, such as cigarettes and e-cigarettes. If you need help quitting, ask your health care provider.  Keep all follow-up visits as told by your health care provider. This is important. How is this prevented?   Get an annual flu shot. You may get the flu shot in late summer, fall, or winter. Ask your health care provider when you should get your flu shot.  Avoid exposing others to your respiratory infection. ? Stay home from work or school as told by your health care provider. ? Wash your hands with soap and water often, especially after you cough or sneeze. If soap and water are not available, use alcohol-based hand sanitizer.  Avoid contact with people who are sick during cold and flu season. This is generally fall and winter. Contact a health care provider if:  Your symptoms last for 10 days or longer.  Your symptoms get worse over time.  You have a fever.  You have severe sinus pain in your face or forehead.  The glands in your jaw or neck become very swollen. Get help right away if you:  Feel pain or pressure in your chest.  Have shortness of breath.  Faint or feel like you will faint.  Have severe and persistent vomiting.  Feel confused or disoriented. Summary  A respiratory infection is an illness that affects part of the respiratory system, such as the lungs, nose, or throat. A respiratory infection that is caused by a virus is called a viral respiratory infection.  Common types of viral respiratory infections are a cold, influenza, and  respiratory syncytial virus (RSV) infection.  Symptoms of this condition include a stuffy or runny nose, cough, sneezing, fatigue, achy muscles, sore throat, and fevers or chills.  Antibiotic medicines are not prescribed for viral infections. This is because antibiotics are designed to kill bacteria. They are not effective against viruses. This information is not intended to replace advice given to you by your health care provider. Make sure you discuss any questions you have with your health care provider. Document Released: 11/10/2004 Document Revised: 03/13/2017 Document Reviewed: 03/13/2017 Elsevier Interactive Patient Education  2019 [employer].

## 2018-05-10 NOTE — Progress Notes (Signed)
HPI: Elizabeth Pruitt is a 58 y.o. 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 Dirk Dress ENT (11/23/2011).  she presents to [facility] today, 05/10/18,  for chief complaint of: Sinus, cough  . Context: Seen 8 days ago treated for sinusitis w/ doxycycline, given instructions for OTC meds as well.   . Location/Quality: sinuses never stopped draining despite abx, now concerned about cough w/ occasional green mucus  . Modifying factors: Sudafed only other Rx she's using.      Past medical, surgical, social and family history reviewed and updated as necessary.   Current medication list and allergy/intolerance information reviewed:    Current Outpatient Medications  Medication Sig Dispense Refill  . albuterol (PROVENTIL HFA;VENTOLIN HFA) 108 (90 Base) MCG/ACT inhaler Inhale 1-2 puffs into the lungs every 6 (six) hours as needed for wheezing or shortness of breath. 1 Inhaler 1  . albuterol (PROVENTIL) (2.5 MG/3ML) 0.083% nebulizer solution Take 3 mLs (2.5 mg total) by nebulization every 6 (six) hours as needed for wheezing or shortness of breath. 150 mL 1  . doxycycline (VIBRA-TABS) 100 MG tablet Take 1 tablet (100 mg total) by mouth 2 (two) times daily. Take with food 14 tablet 0  . EPIPEN 2-PAK 0.3 MG/0.3ML SOAJ injection 1 (ONE) PRE-FILLED PEN SYRINGE AS NEEDED FOR EMERGENCY USE  0  . ipratropium (ATROVENT) 0.06 % nasal spray Place 2 sprays into both nostrils 4 (four) times daily. For up to 1 week 15 mL 0  . guaiFENesin-codeine (ROBITUSSIN AC) 100-10 MG/5ML syrup Take 5 mLs by mouth 3 (three) times daily as needed for cough. 118 mL 0  . predniSONE (STERAPRED UNI-PAK 48 TAB) 10 MG (48) TBPK tablet Take by mouth daily. 12-Day taper, po 48 tablet 0   No current facility-administered medications for this visit.     Allergies  Allergen  Reactions  . Sulfa Antibiotics Hives    Red splotches all over body      Review of Systems:  Constitutional:  No  fever, no chills, +recent illness, No unintentional weight changes. +significant fatigue.   HEENT: No  headache, no vision change, no hearing change, +sore throat, +sinus pressure  Cardiac: No  chest pain, No  pressure, No palpitations  Respiratory:  No  shortness of breath. +Cough  Gastrointestinal: No  abdominal pain, No  nausea, No  vomiting,  No  blood in stool, No  diarrhea  Musculoskeletal: No new myalgia/arthralgia  Skin: No  Rash  Neurologic: No  weakness, No  dizziness  Exam:  BP 128/89 (BP Location: Left Arm, Patient Position: Sitting, Cuff Size: Normal)   Pulse 82   Temp 98.1 F (36.7 C) (Oral)   Wt 106 lb 14.4 oz (48.5 kg)   BMI 20.20 kg/m   Constitutional: VS see above. General Appearance: alert, well-developed, well-nourished, NAD  Eyes: Normal lids and conjunctive, non-icteric sclera  Ears, Nose, Mouth, Throat: MMM, Normal external inspection ears/nares/mouth/lips/gums. TM normal bilaterally. Pharynx/tonsils no erythema, no exudate. Nasal mucosa normal.   Neck: No masses, trachea midline. No tenderness/mass appreciated. No lymphadenopathy  Respiratory: Normal respiratory effort. no wheeze, no rhonchi, no rales  Cardiovascular: S1/S2 normal, no murmur, no rub/gallop auscultated. RRR. No lower extremity edema.   Gastrointestinal: Nontender, no masses. Bowel sounds normal.  Musculoskeletal: Gait normal.   Neurological: Normal balance/coordination. No tremor.   Skin: warm, dry, intact.  Psychiatric: Normal judgment/insight. Normal mood and affect.   No results found for this or any previous visit (from the past 72 hour(s)).  Dg Chest 2 View  Result Date: 05/10/2018 CLINICAL DATA:  Pt c/o continuing cough x 2 weeks, denies any other problems, complaints or symptoms today EXAM: CHEST - 2 VIEW COMPARISON:  None. FINDINGS: Cardiac  silhouette is normal in size and configuration. Normal mediastinal and hilar contours. Lungs are clear.  No pleural effusion or pneumothorax. Skeletal structures are unremarkable. IMPRESSION: No active cardiopulmonary disease. Electronically Signed   By: Amie Portlandavid  Ormond M.D.   On: 05/10/2018 12:54     ASSESSMENT/PLAN: The encounter diagnosis was Cough.   Meds ordered this encounter  Medications  . guaiFENesin-codeine (ROBITUSSIN AC) 100-10 MG/5ML syrup    Sig: Take 5 mLs by mouth 3 (three) times daily as needed for cough.    Dispense:  118 mL    Refill:  0  . predniSONE (STERAPRED UNI-PAK 48 TAB) 10 MG (48) TBPK tablet    Sig: Take by mouth daily. 12-Day taper, po    Dispense:  48 tablet    Refill:  0  . albuterol (PROVENTIL HFA;VENTOLIN HFA) 108 (90 Base) MCG/ACT inhaler    Sig: Inhale 1-2 puffs into the lungs every 6 (six) hours as needed for wheezing or shortness of breath.    Dispense:  1 Inhaler    Refill:  1    Orders Placed This Encounter  Procedures  . DG Chest 2 View    Patient Instructions  If the antibiotics made no difference, likely this was always a viral infection or allergies. I do not think more antibiotics are necessary. You are more likely suffering from post-viral cough syndrome, which should improve with steroids, inhaler, and cough medicine (which I sent to the pharmacy). I also The cough can sometimes last several weeks but should slowly improve. Please come see us again if fever or shortness of breath, as this may be signs of pneumonia, which is a possible complication but is not likely.   Viral Respiratory Infection A respiratory infection is an illness that affects part of the respiratory system, such as the lungs, nose, or throat. A respiratory infection that is caused by a virus is called a viral respiratory infection. Common types of viral respiratory infections include:  A cold.  The flu (influenza).  A respiratory syncytial virus (RSV) infection.  What are the causes? This condition is caused by a virus. What are the signs or symptoms? Symptoms of this condition include:  A stuffy or runny nose.  Yellow or green nasal discharge.  A cough.  Sneezing.  Fatigue.  Achy muscles.  A sore throat.  Sweating or chills.  A fever.  A headache. How is this diagnosed? This condition may be diagnosed based on:  Your symptoms.  A physical exam. How is this treated? This condition may be treated with medicines, such as:  Expectorants. These make it easier to cough up mucus.  Decongestant nasal sprays.  Acetaminophen or NSAIDs to relieve fever and pain. Antibiotic medicines are not prescribed for viral infections. This is because antibiotics are designed to kill bacteria. They are not effective against viruses. Follow these instructions at home:  Managing pain and congestion  Take over-the-counter and prescription medicines only as told by your health care provider.  If you have a sore throat, gargle with a salt-water mixture 3-4 times a day or as needed. To make a salt-water mixture, completely dissolve -1 tsp  of salt in 1 cup of warm water.  Use nose drops made from salt water to ease congestion and soften raw skin around your nose.  Drink enough fluid to keep your urine pale yellow. This helps prevent dehydration and helps loosen up mucus. General instructions  Rest as much as possible.  Do not drink alcohol.  Do not use any products that contain nicotine or tobacco, such as cigarettes and e-cigarettes. If you need help quitting, ask your health care provider.  Keep all follow-up visits as told by your health care provider. This is important. How is this prevented?   Get an annual flu shot. You may get the flu shot in late summer, fall, or winter. Ask your health care provider when you should get your flu shot.  Avoid exposing others to your respiratory infection. ? Stay home from work or school as told by  your health care provider. ? Wash your hands with soap and water often, especially after you cough or sneeze. If soap and water are not available, use alcohol-based hand sanitizer.  Avoid contact with people who are sick during cold and flu season. This is generally fall and winter. Contact a health care provider if:  Your symptoms last for 10 days or longer.  Your symptoms get worse over time.  You have a fever.  You have severe sinus pain in your face or forehead.  The glands in your jaw or neck become very swollen. Get help right away if you:  Feel pain or pressure in your chest.  Have shortness of breath.  Faint or feel like you will faint.  Have severe and persistent vomiting.  Feel confused or disoriented. Summary  A respiratory infection is an illness that affects part of the respiratory system, such as the lungs, nose, or throat. A respiratory infection that is caused by a virus is called a viral respiratory infection.  Common types of viral respiratory infections are a cold, influenza, and respiratory syncytial virus (RSV) infection.  Symptoms of this condition include a stuffy or runny nose, cough, sneezing, fatigue, achy muscles, sore throat, and fevers or chills.  Antibiotic medicines are not prescribed for viral infections. This is because antibiotics are designed to kill bacteria. They are not effective against viruses. This information is not intended to replace advice given to you by your health care provider. Make sure you discuss any questions you have with your health care provider. Document Released: 11/10/2004 Document Revised: 03/13/2017 Document Reviewed: 03/13/2017 Elsevier Interactive Patient Education  2019 [employer].       Visit summary with medication list and pertinent instructions was printed for patient to review. All questions at time of visit were answered - patient instructed to contact office with any additional concerns or updates.  ER/RTC precautions were reviewed with the patient.   Follow-up plan: Return if symptoms worsen or fail to improve.    Please note: voice recognition software was used to produce this document, and typos may escape review. Please contact Dr. Lyn Hollingshead for any needed clarifications.

## 2018-08-08 ENCOUNTER — Encounter: Payer: Self-pay | Admitting: Osteopathic Medicine

## 2018-08-08 ENCOUNTER — Ambulatory Visit (INDEPENDENT_AMBULATORY_CARE_PROVIDER_SITE_OTHER): Payer: Commercial Managed Care - PPO | Admitting: Osteopathic Medicine

## 2018-08-08 VITALS — BP 115/87 | HR 79 | Temp 98.2°F | Wt 107.3 lb

## 2018-08-08 DIAGNOSIS — Z5320 Procedure and treatment not carried out because of patient's decision for unspecified reasons: Secondary | ICD-10-CM | POA: Insufficient documentation

## 2018-08-08 DIAGNOSIS — R6889 Other general symptoms and signs: Secondary | ICD-10-CM

## 2018-08-08 DIAGNOSIS — Z13220 Encounter for screening for lipoid disorders: Secondary | ICD-10-CM

## 2018-08-08 DIAGNOSIS — Z2821 Immunization not carried out because of patient refusal: Secondary | ICD-10-CM

## 2018-08-08 DIAGNOSIS — Z0000 Encounter for general adult medical examination without abnormal findings: Secondary | ICD-10-CM | POA: Diagnosis not present

## 2018-08-08 LAB — CBC
HCT: 40.6 % (ref 35.0–45.0)
Hemoglobin: 13.4 g/dL (ref 11.7–15.5)
MCH: 30.4 pg (ref 27.0–33.0)
MCHC: 33 g/dL (ref 32.0–36.0)
MCV: 92.1 fL (ref 80.0–100.0)
MPV: 10.7 fL (ref 7.5–12.5)
Platelets: 281 10*3/uL (ref 140–400)
RBC: 4.41 10*6/uL (ref 3.80–5.10)
RDW: 11.8 % (ref 11.0–15.0)
WBC: 4.2 10*3/uL (ref 3.8–10.8)

## 2018-08-08 NOTE — Patient Instructions (Addendum)
General Preventive Care  Most recent routine screening lipids/other labs:   Everyone should have blood pressure checked once per year.   Tobacco: don't!   Alcohol: responsible moderation is ok for most adults - if you have concerns about your alcohol intake, please talk to me!   Exercise: as tolerated to reduce risk of cardiovascular disease and diabetes. Strength training will also prevent osteoporosis.   Mental health: if need for mental health care (medicines, counseling, other), or concerns about moods, please let me know!   Sexual health: if need for STD testing, or if concerns with libido/pain problems, please let me know!   Advanced Directive: Living Will and/or Healthcare Power of Attorney recommended for all adults, regardless of age or health.  Vaccines  Flu vaccine: recommended for almost everyone, every fall.   Shingles vaccine: Shingrix recommended after age 9.   Pneumonia vaccines: Prevnar and Pneumovax recommended after age 40, or sooner if certain medical conditions.  Tetanus booster: Tdap recommended every 10 years.  Cancer screenings   Colon cancer screening: recommended for everyone at age 39-75  Breast cancer screening: mammogram recommended annually after age 61.   Cervical cancer screening: Pap every 1 to 5 years depending on age and other risk factors. Can usually stop at age 14 or w/ hysterectomy as long as previous testing normal.   Lung cancer screening: not needed if not smoked in the past 15 years Infection screenings . HIV: recommended screening at least once age 61-65, more often as needed. . Gonorrhea/Chlamydia: screening as needed.  . Hepatitis C: recommended for anyone born 18-1967.  . TB: certain at-risk populations, or depending on work requirements and/or travel history Other . Bone Density Test: recommended for women starting at age 61

## 2018-08-08 NOTE — Progress Notes (Signed)
HPI: Elizabeth Pruitt is a 58 y.o. 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 Coralee Pesa ENT (11/23/2011).  she presents to [facility] today, 08/08/18,  for chief complaint of: Annual physical  Biometric screening    Patient here for annual physical / wellness exam.  See preventive care reviewed as below.    Additional concerns today include: None     Past medical, surgical, social and family history reviewed:  Patient Active Problem List   Diagnosis Date Noted  . Breast screening declined 08/08/2018  . Pap smear of cervix declined 08/08/2018  . Colon cancer screening declined 08/08/2018  . Vaccination not carried out because of patient refusal 08/08/2018  . Raynaud phenomenon 04/09/2015  . GAD (generalized anxiety disorder) 11/24/2014  . Ganglion cyst 11/24/2014  . Vasomotor flushing 07/30/2013  . Muscle spasm 01/06/2012  . Neck pain 01/06/2012  . Bilateral hand numbness 01/06/2012  . Sore Throat - Dr. Coralee Pesa ENT 11/23/2011  . 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 Dysphagia 11/09/2011  . 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 CESAREAN SECTION    . TONSILLECTOMY      Social History   Tobacco Use  . Smoking status: Former Research scientist (life sciences)  . Smokeless tobacco: Never Used  Substance Use Topics  . Alcohol use: No    No family history on file.   Current medication list and allergy/intolerance information reviewed:    Current Outpatient Medications  Medication Sig Dispense Refill  . EPIPEN 2-PAK 0.3 MG/0.3ML SOAJ injection 1 (ONE) PRE-FILLED PEN SYRINGE AS NEEDED FOR EMERGENCY USE  0   No current facility-administered medications for this visit.     Allergies  Allergen Reactions  . Sulfa Antibiotics Hives    Red  splotches all over body      Review of Systems:  Constitutional:  No  fever, no chills, No recent illness, No unintentional weight changes. No significant fatigue.   HEENT: No  headache, no vision change, no hearing change, No sore throat, No  sinus pressure  Cardiac: No  chest pain, No  pressure, No palpitations, No  Orthopnea  Respiratory:  No  shortness of breath. No  Cough  Gastrointestinal: No  abdominal pain, No  nausea, No  vomiting,  No  blood in stool, No  diarrhea, No  constipation   Musculoskeletal: No new myalgia/arthralgia  Skin: No  Rash, No other wounds/concerning lesions  Genitourinary: No  incontinence, No  abnormal genital bleeding, No abnormal genital discharge  Hem/Onc: No  easy bruising/bleeding, No  abnormal lymph node  Endocrine: +cold intolerance,  No heat intolerance. No polyuria/polydipsia/polyphagia   Neurologic: No  weakness, No  dizziness, No  slurred speech/focal weakness/facial droop  Psychiatric: No  concerns with depression, No  concerns with anxiety, No sleep problems, No mood problems  Exam:  BP 115/87 (BP Location: Left Arm, Patient Position: Sitting, Cuff Size: Normal)   Pulse 79   Temp 98.2 F (36.8 C) (Oral)   Wt 107 lb 4.8 oz (48.7 kg)   BMI 20.27 kg/m   Constitutional: VS see above. General Appearance: alert, well-developed, well-nourished, NAD  Eyes: Normal lids and conjunctive, non-icteric sclera  Ears, Nose, Mouth, Throat: wearing mask   Neck: No masses, trachea midline. No thyroid enlargement. No tenderness/mass appreciated. No  lymphadenopathy  Respiratory: Normal respiratory effort. no wheeze, no rhonchi, no rales  Cardiovascular: S1/S2 normal, no murmur, no rub/gallop auscultated. RRR. No lower extremity edema.   Gastrointestinal: Nontender, no masses. No hepatomegaly, no splenomegaly. No hernia appreciated. Bowel sounds normal. Rectal exam deferred.   Musculoskeletal: Gait normal. No clubbing/cyanosis of digits.    Neurological: Normal balance/coordination. No tremor. No cranial nerve deficit on limited exam. Motor and sensation intact and symmetric. Cerebellar reflexes intact.   Skin: warm, dry, intact. No rash/ulcer. No concerning nevi or subq nodules on limited exam.    Psychiatric: Normal judgment/insight. Normal mood and affect. Oriented x3.      ASSESSMENT/PLAN: The primary encounter diagnosis was Annual physical exam. Diagnoses of Breast screening declined, Pap smear of cervix declined, Colon cancer screening declined, Vaccination not carried out because of patient refusal, Cold intolerance, and Lipid screening were also pertinent to this visit.   Orders Placed This Encounter  Procedures  . COMPLETE METABOLIC PANEL WITH GFR  . Lipid panel  . TSH  . CBC    No orders of the defined types were placed in this encounter.   Patient Instructions  General Preventive Care  Most recent routine screening lipids/other labs:   Everyone should have blood pressure checked once per year.   Tobacco: don't!   Alcohol: responsible moderation is ok for most adults - if you have concerns about your alcohol intake, please talk to me!   Exercise: as tolerated to reduce risk of cardiovascular disease and diabetes. Strength training will also prevent osteoporosis.   Mental health: if need for mental health care (medicines, counseling, other), or concerns about moods, please let me know!   Sexual health: if need for STD testing, or if concerns with libido/pain problems, please let me know!   Advanced Directive: Living Will and/or Healthcare Power of Attorney recommended for all adults, regardless of age or health.  Vaccines  Flu vaccine: recommended for almost everyone, every fall.   Shingles vaccine: Shingrix recommended after age 60.   Pneumonia vaccines: Prevnar and Pneumovax recommended after age 60, or sooner if certain medical conditions.  Tetanus booster: Tdap recommended every 10  years.  Cancer screenings   Colon cancer screening: recommended for everyone at age 150-75  Breast cancer screening: mammogram recommended annually after age 60.   Cervical cancer screening: Pap every 1 to 5 years depending on age and other risk factors. Can usually stop at age 60 or w/ hysterectomy as long as previous testing normal.   Lung cancer screening: not needed if not smoked in the past 15 years Infection screenings . HIV: recommended screening at least once age 60-65, more often as needed. . Gonorrhea/Chlamydia: screening as needed.  . Hepatitis C: recommended for anyone born 881945-1967.  . TB: certain at-risk populations, or depending on work requirements and/or travel history Other . Bone Density Test: recommended for women starting at age 10565        Visit summary with medication list and pertinent instructions was printed for patient to review. All questions at time of visit were answered - patient instructed to contact office with any additional concerns or updates. ER/RTC precautions were reviewed with the patient.   Please note: voice recognition software was used to produce this document, and typos may escape review. Please contact Dr. Lyn HollingsheadAlexander for any needed clarifications.     Follow-up plan: Return in about 1 year (around 08/08/2019) [place] (call week prior to visit for lab orders).

## 2018-08-09 LAB — COMPLETE METABOLIC PANEL WITH GFR
AG Ratio: 1.9 (calc) (ref 1.0–2.5)
ALT: 9 U/L (ref 6–29)
AST: 19 U/L (ref 10–35)
Albumin: 4.6 g/dL (ref 3.6–5.1)
Alkaline phosphatase (APISO): 78 U/L (ref 37–153)
BUN: 12 mg/dL (ref 7–25)
CO2: 26 mmol/L (ref 20–32)
Calcium: 9.7 mg/dL (ref 8.6–10.4)
Chloride: 106 mmol/L (ref 98–110)
Creat: 0.68 mg/dL (ref 0.50–0.99)
GFR, Est African American: 110 mL/min/{1.73_m2} (ref >=60)
GFR, Est Non African American: 95 mL/min/{1.73_m2} (ref >=60)
Globulin: 2.4 g/dL (calc) (ref 1.9–3.7)
Glucose, Bld: 95 mg/dL (ref 65–99)
Potassium: 4.6 mmol/L (ref 3.5–5.3)
Sodium: 140 mmol/L (ref 135–146)
Total Bilirubin: 0.4 mg/dL (ref 0.2–1.2)
Total Protein: 7 g/dL (ref 6.1–8.1)

## 2018-08-09 LAB — LIPID PANEL
Cholesterol: 224 mg/dL — ABNORMAL HIGH (ref <200)
HDL: 78 mg/dL (ref >=50)
LDL Cholesterol (Calc): 129 mg/dL (calc) — ABNORMAL HIGH
Non-HDL Cholesterol (Calc): 146 mg/dL (calc) — ABNORMAL HIGH (ref <130)
Total CHOL/HDL Ratio: 2.9 (calc) (ref <5.0)
Triglycerides: 78 mg/dL (ref <150)

## 2018-08-09 LAB — TSH: TSH: 1.49 mIU/L (ref 0.40–4.50)

## 2019-03-22 ENCOUNTER — Telehealth (INDEPENDENT_AMBULATORY_CARE_PROVIDER_SITE_OTHER): Payer: Commercial Managed Care - PPO | Admitting: Medical-Surgical

## 2019-03-22 ENCOUNTER — Encounter: Payer: Self-pay | Admitting: Medical-Surgical

## 2019-03-22 DIAGNOSIS — J0140 Acute pansinusitis, unspecified: Secondary | ICD-10-CM | POA: Diagnosis not present

## 2019-03-22 MED ORDER — DOXYCYCLINE HYCLATE 100 MG PO TABS: 100.0000 mg | ORAL_TABLET | Freq: Two times a day (BID) | ORAL | 0 refills | Status: AC

## 2019-03-22 NOTE — Progress Notes (Signed)
Virtual Visit via Telephone   I connected with  Elizabeth Pruitt  on 03/22/19 by telephone/telehealth and verified that I am speaking with the correct person using two identifiers.   I discussed the limitations, risks, security and privacy concerns of performing an evaluation and management service by telephone, including the higher likelihood of inaccurate diagnosis and treatment, and the availability of in person appointments.  We also discussed the likely need of an additional face to face encounter for complete and high quality delivery of care.  I also discussed with the patient that there may be a patient responsible charge related to this service. The patient expressed understanding and wishes to proceed.  Provider location is either at home or medical facility. Patient location is at their home, different from provider location. People involved in care of the patient during this telehealth encounter were myself, my nurse/medical assistant, and my front office/scheduling team member.  CC: Sinus symptoms  HPI: 59 year old female presented to the office although scheduled for a virtual visit.  Patient was instructed to return to car and visit was completed via telephone. Complains of sinus symptoms for the past 2 weeks.  Reports sore throat, postnasal drip with green/brownish mucus, sinus pain and pressure, headache, eye pressure, chills, and lightheadedness.  No cough, fever, nausea, vomiting, diarrhea.  Has not been tested for Covid.  Tried Bayer aspirin, Advil, and Sudafed with minimal relief.  Review of Systems: No fevers, night sweats, weight loss, chest pain, or shortness of breath.   Objective Findings:    General: Speaking full sentences, no audible heavy breathing.  Sounds alert and appropriately interactive.    Independent interpretation of tests performed by another provider:   None.  Impression and Recommendations:    Acute non-recurrent pansinusitis Doxycycline 100mg  BID x 10  days due to severity of symptoms. May use saline nasal spray as needed. Consider using a cool mist humidifier, especially at night. May use Ibuprofen and Sudafed as needed.  Recommended Covid testing, patient states "I know I do not have Covid, I just have a sinus infection".  Return if symptoms worsen or fail to improve.  I discussed the above assessment and treatment plan with the patient. The patient was provided an opportunity to ask questions and all were answered. The patient agreed with the plan and demonstrated an understanding of the instructions.   The patient was advised to call back or seek an in-person evaluation if the symptoms worsen or if the condition fails to improve as anticipated.   35 minutes of non-face-to-face time was provided during this encounter.  , DNP, APRN, FNP-BC [facility]

## 2019-11-29 NOTE — Telephone Encounter (Signed)
Patient calling in wanting to change to a different PCP. States that she wants to see someone else. Did not give a reason, wanted to keep the reason to herself. Please advise.

## 2019-11-29 NOTE — Telephone Encounter (Signed)
No, needs reason, can talk to Toniann Fail if a complaint

## 2019-11-29 NOTE — Telephone Encounter (Signed)
I read the message from the provider to the patient and she stated if "we were running a communist practice" she continued to yell at me and when I told her to please not yell at me that I am not the one who said that, that I was reading the response from the provider, she continued to yell and I hung up. Please as do as you wish with this patient. I will not be calling her back as she yelled at me for 5 minutes straight.

## 2019-11-29 NOTE — Telephone Encounter (Signed)
I called and spoke with the patient.  Patient wishes to switch to another provider based on the care she has received and lack of compassion from Dr. Lyn Hollingshead.  She has a condition that she spoke with Dr. Lyn Hollingshead about (severe coldness she has) and the patient ask if she could try a medication, patient thought this could be hormones and per patient, Dr. Lyn Hollingshead denied her trying medication to see if it would help.  Dr. Lyn Hollingshead did not offer any other solution and patient felt as she didn't care because it was just brushed off and nothing was done for her. The patient also states she called last year to be switched to another provider after this incident took place but, I don't see any phone notes in patient's chart about this.  Patient is upset and wants her care transferred to another provider.  I'm sending phone note to Dr. Lyn Hollingshead and I will speak with her on Monday so we can resolve this issue.

## 2019-12-02 NOTE — Telephone Encounter (Signed)
Cold intolerance was discussed. I ordered appropriate labs which were normal. Patient demanded thyroid medications anyway and I advised her that there was no medical indication for these and I would not be prescribing them since TSH was WNL. She was upset by this. She is welcome to report me to the [facility] medical board.

## 2020-01-16 ENCOUNTER — Other Ambulatory Visit: Payer: Self-pay | Admitting: Osteopathic Medicine

## 2020-05-06 ENCOUNTER — Other Ambulatory Visit: Payer: Self-pay

## 2020-05-06 ENCOUNTER — Emergency Department (INDEPENDENT_AMBULATORY_CARE_PROVIDER_SITE_OTHER)
Admission: EM | Admit: 2020-05-06 | Discharge: 2020-05-06 | Disposition: A | Discharge status: Home / Self Care | Payer: Commercial Managed Care - PPO | Source: Home / Self Care

## 2020-05-06 ENCOUNTER — Encounter: Payer: Self-pay | Admitting: Emergency Medicine

## 2020-05-06 DIAGNOSIS — M67432 Ganglion, left wrist: Secondary | ICD-10-CM | POA: Diagnosis not present

## 2020-05-06 DIAGNOSIS — M25532 Pain in left wrist: Secondary | ICD-10-CM

## 2020-05-06 DIAGNOSIS — F411 Generalized anxiety disorder: Secondary | ICD-10-CM

## 2020-05-06 DIAGNOSIS — R002 Palpitations: Secondary | ICD-10-CM

## 2020-05-06 NOTE — ED Triage Notes (Signed)
Patient presents to Urgent Care with complaints of left wrist pain since yesterday. Patient reports some pain with movement of left thumb.

## 2020-05-06 NOTE — ED Provider Notes (Signed)
[facility] CARE CENTER   [id number] 05/06/20 Arrival Time: 2297  [id number] PAIN  SUBJECTIVE: History from: patient. Elizabeth Pruitt is a 60 y.o. female complains of L wrist pain that began yesterday. Reports that she has a lump to the volar aspect of the L wrist. Reports some pain with L thumb movement as well. Denies a precipitating event or specific injury. Describes the pain as intermittent and achy in character. Has not tried OTC medications without relief. Symptoms are made worse with activity. Denies similar symptoms in the past. Denies fever, chills, erythema, ecchymosis, effusion, weakness, numbness and tingling, saddle paresthesias, loss of bowel or bladder function.      Also reports palpitations that began yesterday. Reports that she was driving and began to feel anxious. Reports that she had a previous medication, cannot recall the name, for when this happened and that she took a chip of that pill and it helped some.  ROS: As per HPI.  All other pertinent ROS negative.     Past Medical History:  Diagnosis Date  . 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 Sore Throat - Dr. Dirk Dress ENT 11/23/2011   Past Surgical History:  Procedure Laterality Date  . CESAREAN SECTION    . TONSILLECTOMY     Allergies  Allergen Reactions  . Sulfa Antibiotics Hives    Red splotches all over body   No current facility-administered medications on file prior to encounter.   Current Outpatient Medications on File Prior to Encounter  Medication Sig Dispense Refill  . EPIPEN 2-PAK 0.3 MG/0.3ML SOAJ injection 1 (ONE) PRE-FILLED PEN SYRINGE AS NEEDED FOR EMERGENCY USE  0   Social History   Socioeconomic History  . Marital status: Married    Spouse name: Not on file  . Number of children: Not on file  . Years of education: Not on file  . Highest education level: Not on file  Occupational History  . Not on file  Tobacco Use  .  Smoking status: Former Smoker    Types: Cigarettes    Quit date: 02/14/1998    Years since quitting: 22.2  . Smokeless tobacco: Never Used  Substance and Sexual Activity  . Alcohol use: No  . Drug use: No  . Sexual activity: Yes  Other Topics Concern  . Not on file  Social History Narrative  . Not on file   Social Determinants of Health   Financial Resource Strain: Not on file  Food Insecurity: Not on file  Transportation Needs: Not on file  Physical Activity: Not on file  Stress: Not on file  Social Connections: Not on file  Intimate Partner Violence: Not on file   Family History  Problem Relation Age of Onset  . Healthy Mother   . Healthy Father     OBJECTIVE:  Vitals:   05/06/20 0956  BP: 138/90  Pulse: 78  Resp: 16  Temp: 98.2 F (36.8 C)  TempSrc: Oral  SpO2: 97%    General appearance: ALERT; in no acute distress.  Head: NCAT Lungs: Normal respiratory effort CV: pulses 2+ bilaterally. Cap refill < 2 seconds Musculoskeletal:  Inspection: Skin warm, dry, clear and intact, cyst to volar aspect of L wrist No erythema, effusion noted Palpation: Non tender to palpation ROM: FROM active and passive  Skin: warm and dry Neurologic: Ambulates without difficulty; Sensation intact about the upper/ lower extremities Psychological: alert and cooperative; normal mood and affect  DIAGNOSTIC  STUDIES:  No results found.   ASSESSMENT & PLAN:  1. Ganglion cyst of volar aspect of left wrist   2. Left wrist pain   3. Palpitations   4. Anxiety state    Palpitations Anxiety EKG shows NSR Discussed with patient that this likely correlated with her anxiety Follow up with PCP for anxiety management  Left Wrist Pain Lump to volar aspect of L wrist consistent with ganglion cyst. Has hx ganglion cyst.  Continue conservative management of rest, ice, and gentle stretches Take ibuprofen as needed for pain relief (may cause abdominal discomfort, ulcers, and GI bleeds avoid  taking with other NSAIDs) Follow up with PCP if symptoms persist Return or go to the ER if you have any new or worsening symptoms (fever, chills, chest pain, abdominal pain, changes in bowel or bladder habits, pain radiating into lower legs)   Reviewed expectations re: course of current medical issues. Questions answered. Outlined signs and symptoms indicating need for more acute intervention. Patient verbalized understanding. After Visit Summary given.       Moshe Cipro, NP 05/06/20 1027

## 2020-05-06 NOTE — Discharge Instructions (Signed)
It appears that you have a ganglion cyst to your left wrist.   May take ibuprofen or acetaminophen for pain.  Follow up with PCP for referral if this continues to bother you.  Your EKG today was normal

## 2020-08-27 IMAGING — DX CHEST - 2 VIEW
2 series · 2 of 2 positions shown · non-contrast
Comparison: None.

CLINICAL DATA: Pt c/o continuing cough x 2 weeks, denies any other
problems, complaints or symptoms today

EXAM:
CHEST - 2 VIEW

[chest pa]
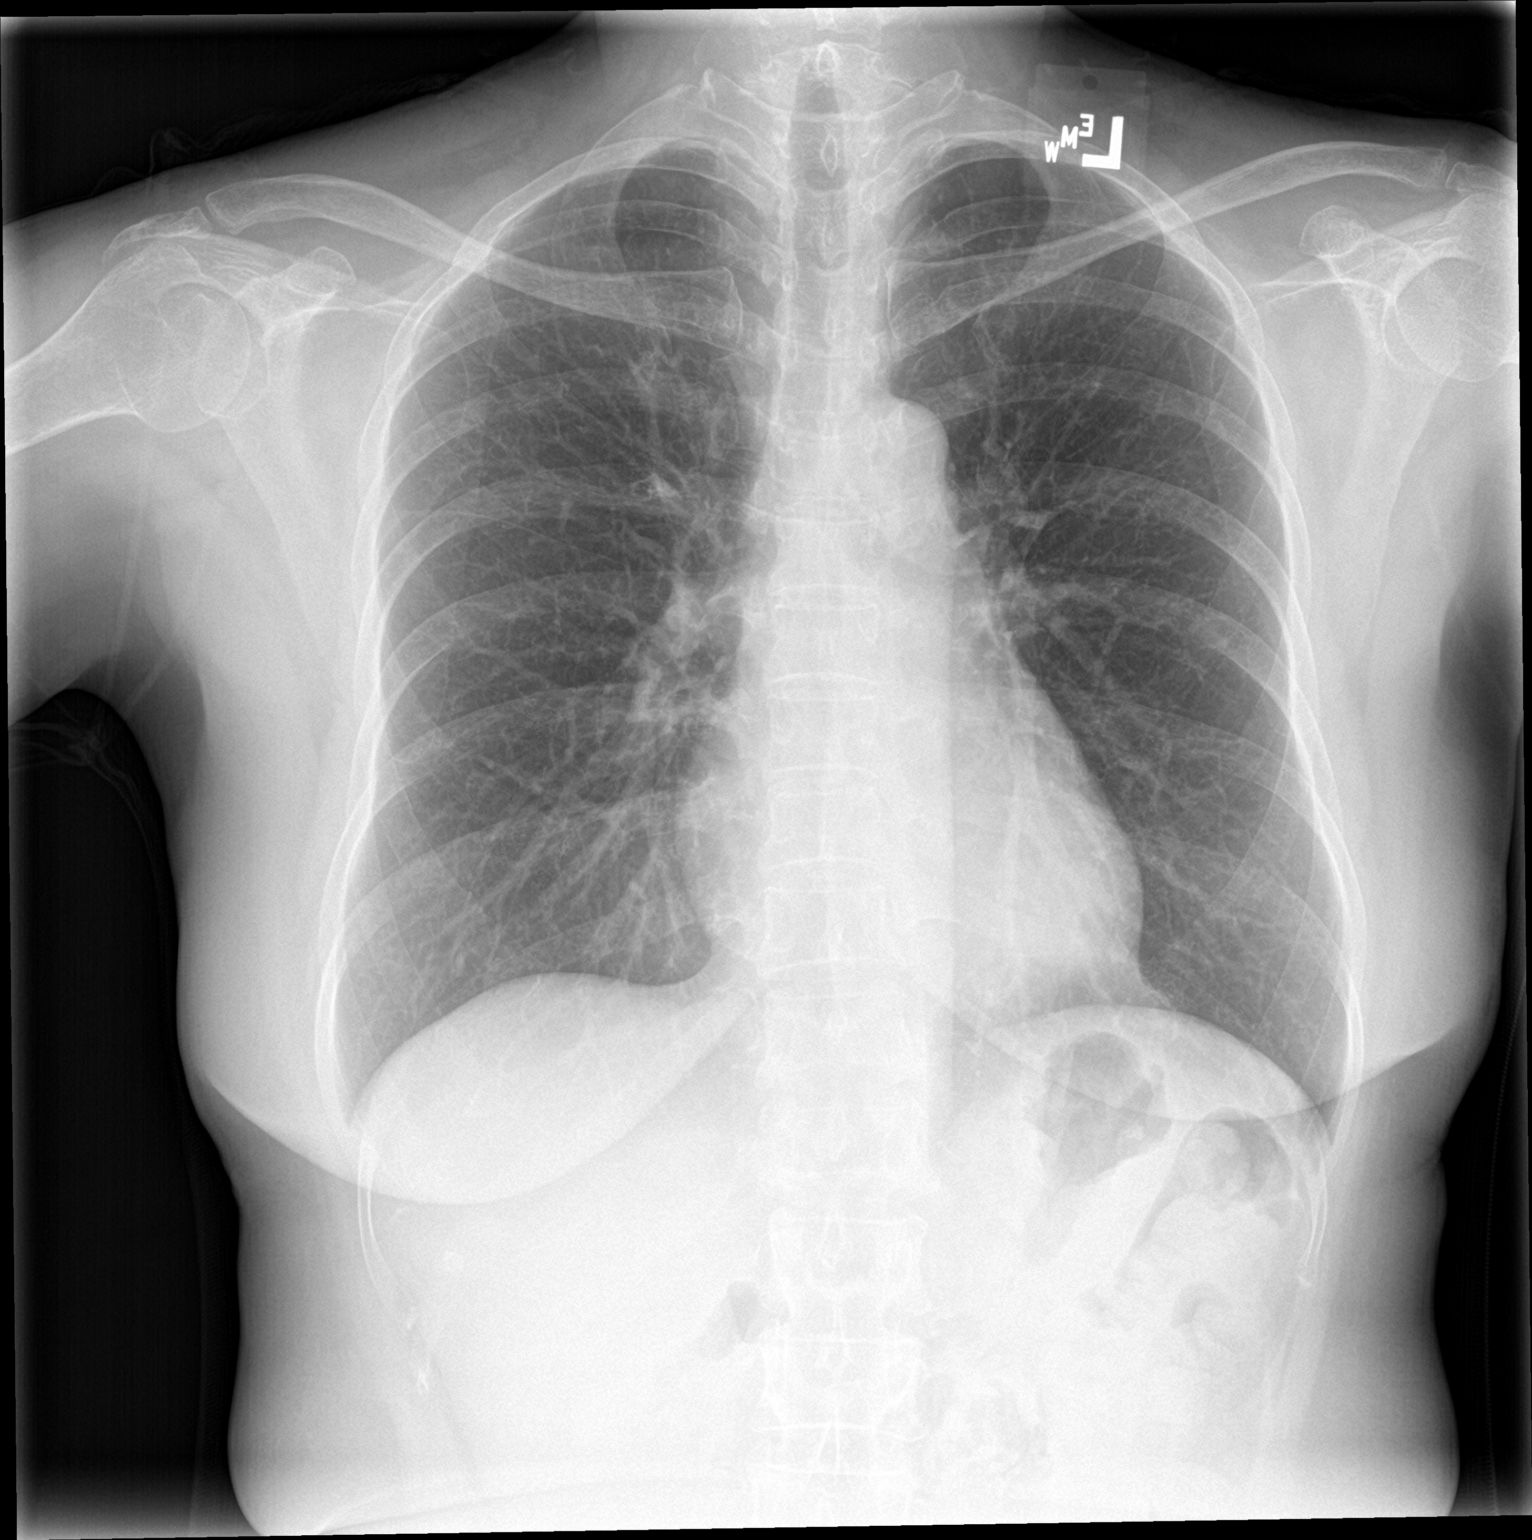

[chest lat]
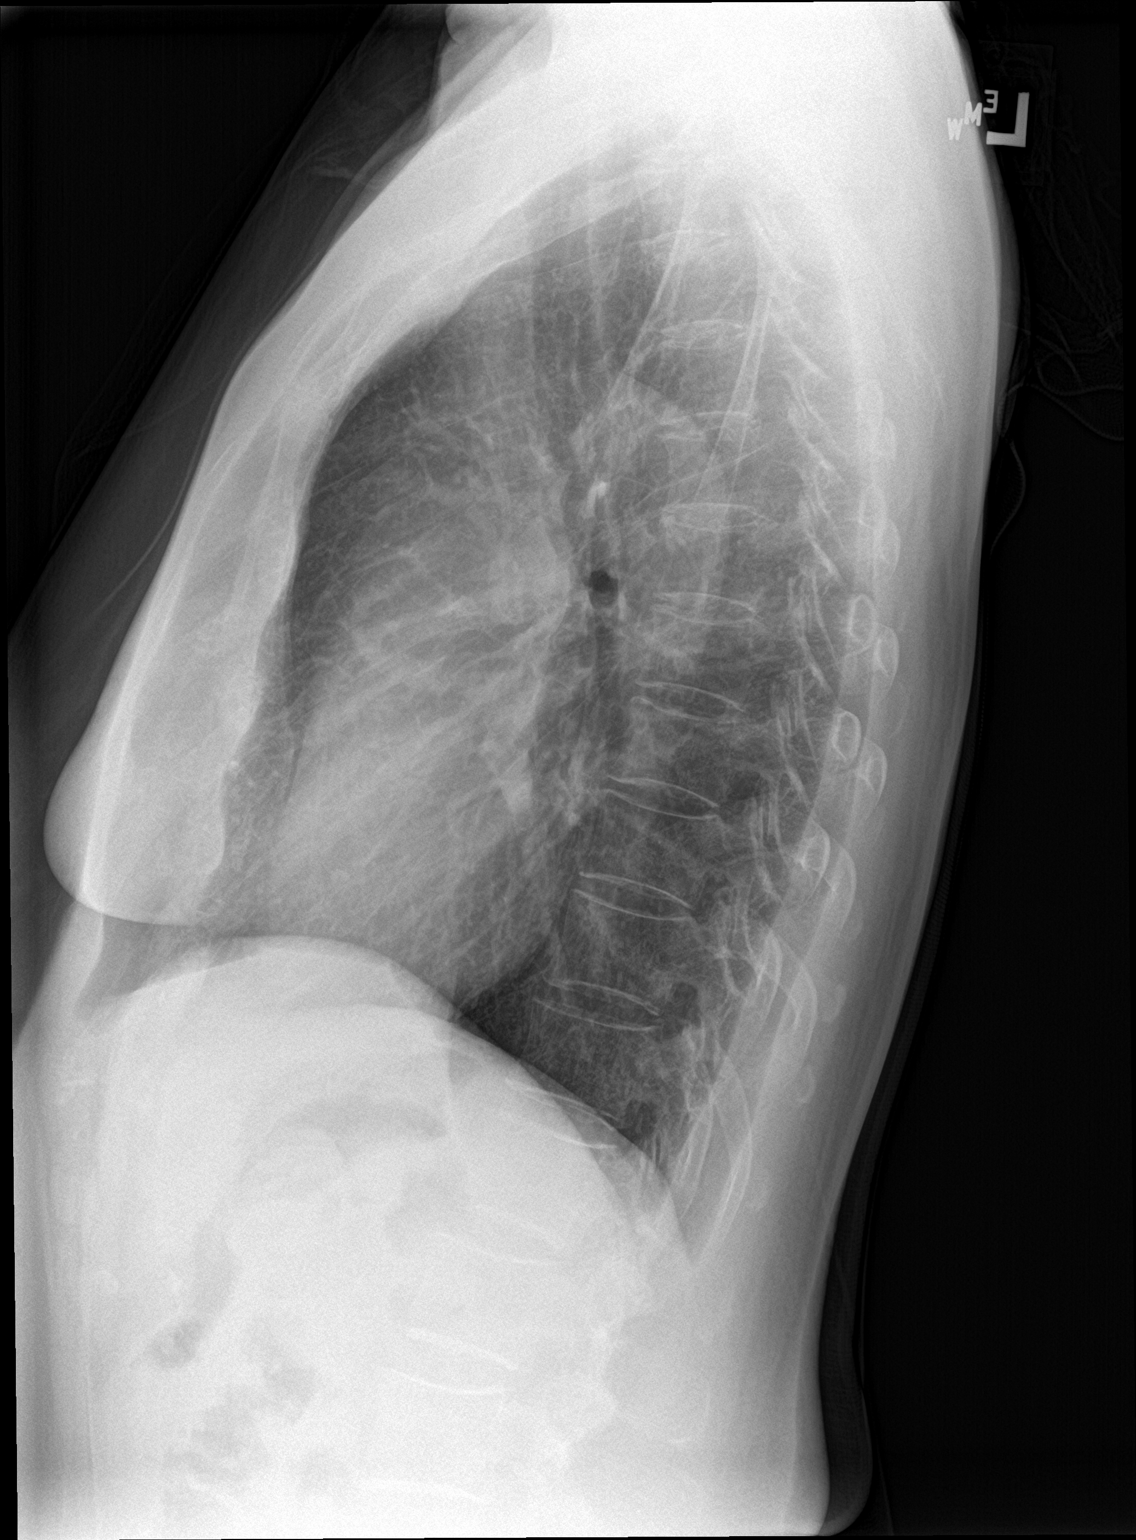

[2 of 2 positions shown; findings below may reference images not displayed]

FINDINGS: Cardiac silhouette is normal in size and configuration. Normal
mediastinal and hilar contours.

Lungs are clear.  No pleural effusion or pneumothorax.

Skeletal structures are unremarkable.
IMPRESSION: No active cardiopulmonary disease.

## 2020-09-01 ENCOUNTER — Telehealth: Payer: Self-pay | Admitting: *Deleted

## 2020-09-01 NOTE — Telephone Encounter (Signed)
Left patient a message to call and schedule New GYN annual.

## 2020-09-24 ENCOUNTER — Ambulatory Visit (INDEPENDENT_AMBULATORY_CARE_PROVIDER_SITE_OTHER): Payer: Commercial Managed Care - PPO | Admitting: Obstetrics and Gynecology

## 2020-09-24 ENCOUNTER — Other Ambulatory Visit (HOSPITAL_COMMUNITY)
Admission: RE | Admit: 2020-09-24 | Discharge: 2020-09-24 | Disposition: A | Discharge status: Home / Self Care | Payer: Commercial Managed Care - PPO | Source: Ambulatory Visit | Attending: Obstetrics and Gynecology | Admitting: Obstetrics and Gynecology

## 2020-09-24 ENCOUNTER — Encounter: Payer: Self-pay | Admitting: Obstetrics and Gynecology

## 2020-09-24 ENCOUNTER — Other Ambulatory Visit: Payer: Self-pay

## 2020-09-24 VITALS — BP 113/69 | HR 71 | Ht 60.0 in | Wt 106.0 lb

## 2020-09-24 DIAGNOSIS — Z1239 Encounter for other screening for malignant neoplasm of breast: Secondary | ICD-10-CM

## 2020-09-24 DIAGNOSIS — Z01419 Encounter for gynecological examination (general) (routine) without abnormal findings: Secondary | ICD-10-CM | POA: Diagnosis not present

## 2020-09-24 DIAGNOSIS — N951 Menopausal and female climacteric states: Secondary | ICD-10-CM | POA: Diagnosis not present

## 2020-09-24 MED ORDER — ESTRADIOL 0.1 MG/GM VA CREA: TOPICAL_CREAM | VAGINAL | 12 refills | Status: DC

## 2020-09-24 NOTE — Patient Instructions (Addendum)
   Over the Counter options Replens 2.  Silicone based lubricants 3. Coconut Oil 4. Vaginal Estrogen

## 2020-09-24 NOTE — Progress Notes (Signed)
GYNECOLOGY ANNUAL PREVENTATIVE CARE ENCOUNTER NOTE  History:     Elizabeth Pruitt is a 60 y.o. [phone] female here for a routine annual gynecologic exam.  Current complaints: vaginal dryness that has particularly worsened over the last year. She denies PMB.   Denies abnormal vaginal bleeding, discharge, pelvic pain, problems with intercourse or other gynecologic concerns.      Gynecologic History No LMP recorded. Patient is postmenopausal.  Last Pap: unknown. She denies h/o abnormal paps Last Mammogram: 2015.  Result was normal Last Colonoscopy: none.  She declines  Social History:  reports that she quit smoking about 22 years ago. Her smoking use included cigarettes. She has never used smokeless tobacco. She reports that she does not drink alcohol and does not use drugs.   The following portions of the patient's history were reviewed and updated as appropriate: allergies, current medications, past family history, past medical history, past social history, past surgical history and problem list.  Review of Systems Pertinent items noted in HPI and remainder of comprehensive ROS otherwise negative.  Physical Exam:  BP 113/69   Pulse 71   Ht 5' (1.524 m)   Wt 106 lb (48.1 kg)   BMI 20.70 kg/m  CONSTITUTIONAL: Well-developed, well-nourished female in no acute distress.  HENT:  Normocephalic, atraumatic, External right and left ear normal.  EYES: Conjunctivae and EOM are normal. Pupils are equal, round, and reactive to light. No scleral icterus.  NECK: Normal range of motion, supple, no masses.  Normal thyroid.  SKIN: Skin is warm and dry. No rash noted. Not diaphoretic. No erythema. No pallor. MUSCULOSKELETAL: Normal range of motion. No tenderness.  No cyanosis, clubbing, or edema. NEUROLOGIC: Alert and oriented to person, place, and time. Normal reflexes, muscle tone coordination.  PSYCHIATRIC: Normal mood and affect. Normal behavior. Normal judgment and thought  content.  CARDIOVASCULAR: Normal heart rate noted, regular rhythm RESPIRATORY: Clear to auscultation bilaterally. Effort and breath sounds normal, no problems with respiration noted.  BREASTS: Symmetric in size. No masses, tenderness, skin changes, nipple drainage, or lymphadenopathy bilaterally. Performed in the presence of a chaperone. ABDOMEN: Soft, no distention noted.  No tenderness, rebound or guarding.  PELVIC: External genitalia normal, Vagina normal without discharge, cervix normal in appearance, no CMT, uterus normal size, shape, and consistency, vaginal atrophy noted. Performed in the presence of a chaperone.    Assessment and Plan:    1. Encounter for annual routine gynecological examination - Cervical cancer screening: Discussed guidelines. Pap with HPV done  - Breast Health: Encouraged self breast awareness/SBE. Discussed limits of clinical breast exam for detecting breast cancer. Discussed importance of annual MXR.  Rx given - Climacteric/Sexual health: Reviewed typical and atypical symptoms of menopause/peri-menopause. Discussed PMB and to call if any amount of spotting.  - Bone Health: Calcium via diet and supplementation. Discussed weight bearing exercise. DEXA due  per PCP - Colonoscopy: Per PCP - F/U 12 months and prn  - Cytology - PAP  2. Encounter for screening for malignant neoplasm of breast, unspecified screening modality - Initially declined MXR but accepts Rx following our discussion - MM 3D SCREEN BREAST BILATERAL; Future  3. Vaginal dryness, menopausal - Vaginal dryness most likely due to being postmenopausal - Recommended OTC lubricants i.e. silicone or oil based as well as vaginal moisturizer i.e. Replens - Discussed if these initial options fail, would also consider vaginal estrogen. Discussed different formulations of vaginal estrogen. Reviewed the risks of vaginal estrogen.  - She would like to potentially  try all three.  - estradiol (ESTRACE) 0.1 MG/GM  vaginal cream; Apply 1 gram per vagina every night for 2 weeks, then apply 1-3  times a week  Dispense: 30 g; Refill: 12     Routine preventative health maintenance measures emphasized. Please refer to After Visit Summary for other counseling recommendations.      Milas Hock, MD, FACOG Obstetrician & Gynecologist, [facility] for [facility], [facility]

## 2020-09-28 LAB — CYTOLOGY - PAP
Comment: NEGATIVE
Diagnosis: NEGATIVE
High risk HPV: NEGATIVE

## 2021-11-19 ENCOUNTER — Ambulatory Visit
Admission: EM | Admit: 2021-11-19 | Discharge: 2021-11-19 | Disposition: A | Discharge status: Home / Self Care | Payer: Commercial Managed Care - PPO | Attending: Family Medicine | Admitting: Family Medicine

## 2021-11-19 DIAGNOSIS — H9202 Otalgia, left ear: Secondary | ICD-10-CM | POA: Diagnosis not present

## 2021-11-19 MED ORDER — AMOXICILLIN-POT CLAVULANATE 500-125 MG PO TABS: 1.0000 | ORAL_TABLET | Freq: Two times a day (BID) | ORAL | 0 refills | Status: AC

## 2021-11-19 NOTE — Discharge Instructions (Addendum)
Advised patient to take medication as directed with food to completion.  Encouraged patient to increase daily water intake while taking this medication.  Advised if symptoms worsen and/or unresolved please follow-up with PCP or ENT for further evaluation.

## 2021-11-19 NOTE — ED Triage Notes (Signed)
Pt c/o LT ear pain x 2 weeks. Worsening in last couple days. Sometimes feeling dizzy and like her equilibrium is off.

## 2021-11-19 NOTE — ED Provider Notes (Signed)
[facility]    CSN: [id number] Arrival date & time: 11/19/21  1408      History   Chief Complaint Chief Complaint  Patient presents with   Otalgia    LT    HPI Elizabeth Pruitt is a 61 y.o. female.   HPI 61 year old female presents with left ear pain for 2 weeks.  Patient reports worsening over the past 2 days and feels dizziness or like her equilibrium is off.  PMH significant for disorder of tympanic membrane of left ear.  PMH significant for previous perforation of left TM, bilateral hand numbness, and GAD.  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 Coralee Pesa ENT 11/23/2011    Patient Active Problem List   Diagnosis Date Noted   Breast screening declined 08/08/2018   Colon cancer screening declined 08/08/2018   Vaccination not carried out because of patient refusal 08/08/2018   Raynaud phenomenon 04/09/2015   GAD (generalized anxiety disorder) 11/24/2014   Ganglion cyst 11/24/2014   Muscle spasm 01/06/2012   Neck pain 01/06/2012   Bilateral hand numbness 01/06/2012   Dysphagia 11/09/2011    Past Surgical History:  Procedure Laterality Date   CESAREAN SECTION     TONSILLECTOMY      OB History     Gravida  4   Para  3   Term  3   Preterm  0   AB  1   Living  3      SAB  0   IAB  1   Ectopic  0   Multiple  0   Live Births  3            Home Medications    Prior to Admission medications   Medication Sig Start Date End Date Taking? Authorizing Provider  amoxicillin-clavulanate (AUGMENTIN) 500-125 MG tablet Take 1 tablet (500 mg total) by mouth 2 (two) times daily for 7 days. 11/19/21 11/26/21 Yes Eliezer Lofts, FNP  EPIPEN 2-PAK 0.3 MG/0.3ML SOAJ injection 1 (ONE) PRE-FILLED PEN SYRINGE AS NEEDED FOR EMERGENCY USE 08/19/16   [provider]  estradiol (ESTRACE) 0.1 MG/GM vaginal cream Apply 1 gram  per vagina every night for 2 weeks, then apply 1-3  times a week 09/24/20   Radene Gunning, MD    Family History Family History  Problem Relation Age of Onset   Healthy Mother    Healthy Father     Social History Social History   Tobacco Use   Smoking status: Former    Types: Cigarettes    Quit date: 02/14/1998    Years since quitting: 23.7   Smokeless tobacco: Never  Vaping Use   Vaping Use: Never used  Substance Use Topics   Alcohol use: No   Drug use: No     Allergies   Sulfa antibiotics   Review of Systems Review of Systems  HENT:  Positive for ear pain.   All other systems reviewed and are negative.    Physical Exam Triage Vital Signs ED Triage Vitals  Enc Vitals Group     BP 11/19/21 1416 (!) 159/90     Pulse Rate 11/19/21 1416 84     Resp 11/19/21 1416 17     Temp 11/19/21 1416 97.9 F (36.6 C)     Temp Source 11/19/21 1416 Oral     SpO2 11/19/21 1416 98 %  Weight --      Height --      Head Circumference --      Peak Flow --      Pain Score 11/19/21 1418 7     Pain Loc --      Pain Edu? --      Excl. in [place]? --    No data found.  Updated Vital Signs BP (!) 159/90 (BP Location: Right Arm)   Pulse 84   Temp 97.9 F (36.6 C) (Oral)   Resp 17   SpO2 98%       Physical Exam Vitals and nursing note reviewed.  Constitutional:      Appearance: Normal appearance. She is normal weight.  HENT:     Head: Normocephalic and atraumatic.     Right Ear: Tympanic membrane, ear canal and external ear normal.     Left Ear: Tympanic membrane, ear canal and external ear normal.     Mouth/Throat:     Mouth: Mucous membranes are moist.     Pharynx: Oropharynx is clear.  Eyes:     Extraocular Movements: Extraocular movements intact.     Conjunctiva/sclera: Conjunctivae normal.     Pupils: Pupils are equal, round, and reactive to light.  Cardiovascular:     Rate and Rhythm: Normal rate and regular rhythm.     Pulses: Normal pulses.     Heart sounds:  Normal heart sounds. No murmur heard. Pulmonary:     Effort: Pulmonary effort is normal.     Breath sounds: Normal breath sounds. No wheezing, rhonchi or rales.  Musculoskeletal:     Cervical back: Normal range of motion and neck supple.  Skin:    General: Skin is warm and dry.  Neurological:     General: No focal deficit present.     Mental Status: She is alert and oriented to person, place, and time. Mental status is at baseline.      UC Treatments / Results  Labs (all labs ordered are listed, but only abnormal results are displayed) Labs Reviewed - No data to display  EKG   Radiology No results found.  Procedures Procedures (including critical care time)  Medications Ordered in UC Medications - No data to display  Initial Impression / Assessment and Plan / UC Course  I have reviewed the triage vital signs and the nursing notes.  Pertinent labs & imaging results that were available during my care of the patient were reviewed by me and considered in my medical decision making (see chart for details).     MDM: 1.  Otalgia of left ear-Patient advised that left EAC and left TM were clear with no infection; however, patient requested antibiotics for ear pain.  Rx'd Augmentin. Advised patient to take medication as directed with food to completion.  Encouraged patient to increase daily water intake while taking this medication.  Advised if symptoms worsen and/or unresolved please follow-up with PCP or ENT for further evaluation.  Patient discharged home, hemodynamically stable.  Final Clinical Impressions(s) / UC Diagnoses   Final diagnoses:  Otalgia of left ear     Discharge Instructions      Advised patient to take medication as directed with food to completion.  Encouraged patient to increase daily water intake while taking this medication.  Advised if symptoms worsen and/or unresolved please follow-up with PCP or ENT for further evaluation.     ED Prescriptions      Medication Sig Dispense Auth. Provider   amoxicillin-clavulanate (  AUGMENTIN) 500-125 MG tablet Take 1 tablet (500 mg total) by mouth 2 (two) times daily for 7 days. 14 tablet Eliezer Lofts, FNP      PDMP not reviewed this encounter.   Eliezer Lofts, [place] 11/19/21 1454

## 2021-11-20 ENCOUNTER — Telehealth: Payer: Self-pay

## 2021-11-20 NOTE — Telephone Encounter (Signed)
Called pt to check on status since UC visit. States feeling a little better after starting abx. Advised to call back if any questions or concerns.

## 2022-12-05 ENCOUNTER — Ambulatory Visit (INDEPENDENT_AMBULATORY_CARE_PROVIDER_SITE_OTHER): Payer: Commercial Managed Care - PPO | Admitting: Family Medicine

## 2022-12-05 ENCOUNTER — Encounter: Payer: Self-pay | Admitting: Family Medicine

## 2022-12-05 VITALS — BP 144/96 | HR 77 | Ht 60.0 in | Wt 102.8 lb

## 2022-12-05 DIAGNOSIS — Z91030 Bee allergy status: Secondary | ICD-10-CM | POA: Diagnosis not present

## 2022-12-05 DIAGNOSIS — L299 Pruritus, unspecified: Secondary | ICD-10-CM | POA: Insufficient documentation

## 2022-12-05 MED ORDER — EPIPEN 2-PAK 0.3 MG/0.3ML IJ SOAJ: 0.3000 mg | INTRAMUSCULAR | 0 refills | Status: AC | PRN

## 2022-12-05 MED ORDER — FLUOCINONIDE EMULSIFIED BASE 0.05 % EX CREA: 1.0000 | TOPICAL_CREAM | Freq: Two times a day (BID) | CUTANEOUS | 2 refills | Status: AC

## 2022-12-05 NOTE — Assessment & Plan Note (Signed)
Pt has hx of pruritus that she uses fluocinonide, have given refill

## 2022-12-05 NOTE — Assessment & Plan Note (Signed)
Epi pen refilled and pt given instructions for how to use

## 2022-12-05 NOTE — Progress Notes (Signed)
New patient visit   Patient: Elizabeth Pruitt   DOB: November 26, 1960   62 y.o. Female  MRN: [id number] Visit Date: 12/05/2022  Today's healthcare provider: Charlton Amor, DO   Chief Complaint  Patient presents with   New Patient (Initial Visit)    Establish Care    SUBJECTIVE    Chief Complaint  Patient presents with   New Patient (Initial Visit)    Establish Care   HPI HPI     New Patient (Initial Visit)    Additional comments: Establish Care      Last edited by Roselyn Reef, CMA on 12/05/2022 10:08 AM.     Pt presents to establish care.   She declines colonoscopy. Did have mammogram last year.   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  No family hx of cancer, htn, or dm  Review of Systems  Constitutional:  Negative for activity change, fatigue and fever.  Respiratory:  Negative for cough and shortness of breath.   Cardiovascular:  Negative for chest pain.  Gastrointestinal:  Negative for abdominal pain.  Genitourinary:  Negative for difficulty urinating.       Current Meds  Medication Sig   EPIPEN 2-PAK 0.3 MG/0.3ML SOAJ injection Inject 0.3 mg into the muscle as needed for anaphylaxis.   fluocinonide-emollient (LIDEX-E) 0.05 % cream Apply 1 Application topically 2 (two) times daily.    OBJECTIVE    BP (!) 144/96 (BP Location: Left Arm, Patient Position: Sitting, Cuff Size: Normal)   Pulse 77   Ht 5' (1.524 m)   Wt 102 lb 12 oz (46.6 kg)   SpO2 97%   BMI 20.07 kg/m   Physical Exam Vitals and nursing note reviewed.  Constitutional:      General: She is not in acute distress.    Appearance: Normal appearance.  HENT:     Head: Normocephalic and atraumatic.     Right Ear: External ear normal.     Left Ear: External ear normal.     Nose: Nose normal.  Eyes:     Conjunctiva/sclera: Conjunctivae normal.  Cardiovascular:     Rate and Rhythm: Normal rate and regular rhythm.  Pulmonary:     Effort: Pulmonary effort is  normal.     Breath sounds: Normal breath sounds.  Abdominal:     General: Abdomen is flat. Bowel sounds are normal.     Palpations: Abdomen is soft.  Neurological:     General: No focal deficit present.     Mental Status: She is alert and oriented to person, place, and time.  Psychiatric:        Mood and Affect: Mood normal.        Behavior: Behavior normal.        Thought Content: Thought content normal.        Judgment: Judgment normal.        ASSESSMENT & PLAN    Problem List Items Addressed This Visit       Musculoskeletal and Integument   Pruritus - Primary    Pt has hx of pruritus that she uses fluocinonide, have given refill      Relevant Medications   fluocinonide-emollient (LIDEX-E) 0.05 % cream     Other   Bee sting allergy    Epi pen refilled and pt given instructions for how to use      Relevant Medications   EPIPEN 2-PAK 0.3 MG/0.3ML SOAJ injection  Return physical.      Meds ordered this encounter  Medications   EPIPEN 2-PAK 0.3 MG/0.3ML SOAJ injection    Sig: Inject 0.3 mg into the muscle as needed for anaphylaxis.    Dispense:  1 each    Refill:  0   fluocinonide-emollient (LIDEX-E) 0.05 % cream    Sig: Apply 1 Application topically 2 (two) times daily.    Dispense:  60 g    Refill:  2    No orders of the defined types were placed in this encounter.    Charlton Amor, DO  [facility] at [facility] [phone] (phone) [phone] (fax)  [facility]

## 2022-12-13 ENCOUNTER — Ambulatory Visit (INDEPENDENT_AMBULATORY_CARE_PROVIDER_SITE_OTHER): Payer: Commercial Managed Care - PPO | Admitting: Family Medicine

## 2022-12-13 ENCOUNTER — Encounter: Payer: Self-pay | Admitting: Family Medicine

## 2022-12-13 VITALS — BP 119/82 | HR 78 | Ht 60.0 in | Wt 103.8 lb

## 2022-12-13 DIAGNOSIS — K219 Gastro-esophageal reflux disease without esophagitis: Secondary | ICD-10-CM | POA: Insufficient documentation

## 2022-12-13 DIAGNOSIS — Z0000 Encounter for general adult medical examination without abnormal findings: Secondary | ICD-10-CM | POA: Diagnosis not present

## 2022-12-13 MED ORDER — NIZATIDINE 150 MG PO CAPS: 150.0000 mg | ORAL_CAPSULE | Freq: Every day | ORAL | 2 refills | Status: DC

## 2022-12-13 NOTE — Progress Notes (Signed)
Established patient visit   Patient: Elizabeth Pruitt   DOB: 1960-12-22   62 y.o. Female  MRN: [id number] Visit Date: 12/13/2022  Today's healthcare provider: Charlton Amor, DO   Chief Complaint  Patient presents with   Annual Exam    SUBJECTIVE    Chief Complaint  Patient presents with   Annual Exam   HPI  Pt presents for wellness visit.   Review of Systems  Constitutional:  Negative for activity change, fatigue and fever.  Respiratory:  Negative for cough and shortness of breath.   Cardiovascular:  Negative for chest pain.  Gastrointestinal:  Negative for abdominal pain.  Genitourinary:  Negative for difficulty urinating.       Current Meds  Medication Sig   EPIPEN 2-PAK 0.3 MG/0.3ML SOAJ injection Inject 0.3 mg into the muscle as needed for anaphylaxis.   fluocinonide-emollient (LIDEX-E) 0.05 % cream Apply 1 Application topically 2 (two) times daily.   nizatidine (AXID) 150 MG capsule Take 1 capsule (150 mg total) by mouth daily.    OBJECTIVE    BP 119/82 (BP Location: Left Arm, Patient Position: Sitting, Cuff Size: Small)   Pulse 78   Ht 5' (1.524 m)   Wt 103 lb 12 oz (47.1 kg)   SpO2 99%   BMI 20.26 kg/m   Physical Exam Vitals and nursing note reviewed.  Constitutional:      General: She is not in acute distress.    Appearance: Normal appearance.  HENT:     Head: Normocephalic and atraumatic.     Right Ear: External ear normal.     Left Ear: External ear normal.     Nose: Nose normal.  Eyes:     Conjunctiva/sclera: Conjunctivae normal.  Cardiovascular:     Rate and Rhythm: Normal rate and regular rhythm.  Pulmonary:     Effort: Pulmonary effort is normal.     Breath sounds: Normal breath sounds.  Musculoskeletal:     Comments: 5/5 muscle strength upper and lower extremity bilaterally  Neurological:     General: No focal deficit present.     Mental Status: She is alert and oriented to person, place, and time.  Psychiatric:        Mood and  Affect: Mood normal.        Behavior: Behavior normal.        Thought Content: Thought content normal.        Judgment: Judgment normal.        ASSESSMENT & PLAN    Problem List Items Addressed This Visit       Digestive   Gastroesophageal reflux disease without esophagitis    Pt says she was previously on axid and would like to go back on to help control her reflux. Will go ahead and order today.      Relevant Medications   nizatidine (AXID) 150 MG capsule     Other   Routine adult health maintenance - Primary    Blood work ordered and once back will fill out and fax form for her work  - pt says she has gotten mammogram done in the past and declines colon cancer screening      Relevant Orders   CBC   Basic Metabolic Panel (BMET)   Lipid panel    Return in about 1 year (around 12/13/2023) for wellness.      Meds ordered this encounter  Medications   nizatidine (AXID) 150 MG capsule    Sig: Take  1 capsule (150 mg total) by mouth daily.    Dispense:  30 capsule    Refill:  2    Orders Placed This Encounter  Procedures   CBC   Basic Metabolic Panel (BMET)   Lipid panel     Charlton Amor, DO  [facility] Medicine at [facility] [phone] (phone) [phone] (fax)  [facility]

## 2022-12-13 NOTE — Assessment & Plan Note (Signed)
Blood work ordered and once back will fill out and fax form for her work  - pt says she has gotten mammogram done in the past and declines colon cancer screening

## 2022-12-13 NOTE — Assessment & Plan Note (Signed)
Pt says she was previously on axid and would like to go back on to help control her reflux. Will go ahead and order today.

## 2022-12-14 LAB — CBC
Hematocrit: 41.5 % (ref 34.0–46.6)
Hemoglobin: 13.2 g/dL (ref 11.1–15.9)
MCH: 29.7 pg (ref 26.6–33.0)
MCHC: 31.8 g/dL (ref 31.5–35.7)
MCV: 94 fL (ref 79–97)
Platelets: 448 x10E3/uL (ref 150–450)
RBC: 4.44 x10E6/uL (ref 3.77–5.28)
RDW: 11.9 % (ref 11.7–15.4)
WBC: 5.2 x10E3/uL (ref 3.4–10.8)

## 2022-12-14 LAB — BASIC METABOLIC PANEL WITH GFR
BUN/Creatinine Ratio: 14 (ref 12–28)
BUN: 10 mg/dL (ref 8–27)
CO2: 24 mmol/L (ref 20–29)
Calcium: 9.8 mg/dL (ref 8.7–10.3)
Chloride: 103 mmol/L (ref 96–106)
Creatinine, Ser: 0.69 mg/dL (ref 0.57–1.00)
Glucose: 87 mg/dL (ref 70–99)
Potassium: 4.3 mmol/L (ref 3.5–5.2)
Sodium: 141 mmol/L (ref 134–144)
eGFR: 97 mL/min/1.73

## 2022-12-14 LAB — LIPID PANEL
Chol/HDL Ratio: 2.9 ratio (ref 0.0–4.4)
Cholesterol, Total: 234 mg/dL — ABNORMAL HIGH (ref 100–199)
HDL: 81 mg/dL (ref >39)
LDL Chol Calc (NIH): 131 mg/dL — ABNORMAL HIGH (ref 0–99)
Triglycerides: 126 mg/dL (ref 0–149)
VLDL Cholesterol Cal: 22 mg/dL (ref 5–40)

## 2023-03-03 ENCOUNTER — Other Ambulatory Visit: Payer: Self-pay | Admitting: Family Medicine

## 2023-05-31 ENCOUNTER — Ambulatory Visit: Payer: Self-pay | Admitting: Family Medicine

## 2023-05-31 NOTE — Telephone Encounter (Signed)
 2nd attempt LVM

## 2023-05-31 NOTE — Telephone Encounter (Signed)
 3rd attempt, no answer- [facility] [phone]; will route to clinic for f/u

## 2023-05-31 NOTE — Telephone Encounter (Signed)
 Copied from CRM [id number]. Topic: Clinical - Red Word Triage >> May 31, 2023 10:53 AM Retta Caster wrote: Red Word that prompted transfer to Nurse Triage: Request for xray orders- Stomach pain/Digestion issues for 1 yr. Medication does not help/ sever indigestion  1st attempt, no answer LVM with CB#[phone]

## 2023-06-05 NOTE — Telephone Encounter (Signed)
 Attempted call to patient. Left detailed voice mail message on patient home # ( allowed on DPR )  Requesting a return call to assist her in scheduling with a provider to help with her listed complaints and request for x-ray.

## 2023-06-06 ENCOUNTER — Encounter: Payer: Self-pay | Admitting: Physician Assistant

## 2023-06-06 ENCOUNTER — Ambulatory Visit (INDEPENDENT_AMBULATORY_CARE_PROVIDER_SITE_OTHER): Admitting: Physician Assistant

## 2023-06-06 ENCOUNTER — Ambulatory Visit: Payer: Self-pay

## 2023-06-06 VITALS — BP 128/86 | HR 68 | Ht 60.0 in | Wt 98.0 lb

## 2023-06-06 DIAGNOSIS — R198 Other specified symptoms and signs involving the digestive system and abdomen: Secondary | ICD-10-CM | POA: Diagnosis not present

## 2023-06-06 DIAGNOSIS — K219 Gastro-esophageal reflux disease without esophagitis: Secondary | ICD-10-CM | POA: Insufficient documentation

## 2023-06-06 DIAGNOSIS — R110 Nausea: Secondary | ICD-10-CM | POA: Diagnosis not present

## 2023-06-06 DIAGNOSIS — R1013 Epigastric pain: Secondary | ICD-10-CM | POA: Insufficient documentation

## 2023-06-06 MED ORDER — OMEPRAZOLE 40 MG PO CPDR
40.0000 mg | DELAYED_RELEASE_CAPSULE | Freq: Two times a day (BID) | ORAL | 2 refills | Status: DC
Start: 2023-06-06 — End: 2023-09-04

## 2023-06-06 NOTE — Patient Instructions (Signed)
Hiatal Hernia  A hiatal hernia occurs when part of the stomach slides above the muscle that separates the abdomen from the chest (diaphragm). A person can be born with a hiatal hernia (congenital), or it may develop over time. In almost all cases of hiatal hernia, only the top part of the stomach pushes through the diaphragm. Many people have a hiatal hernia with no symptoms. The larger the hernia, the more likely it is that you will have symptoms. In some cases, a hiatal hernia allows stomach acid to flow back into the tube that carries food from your mouth to your stomach (esophagus). This may cause heartburn symptoms. The development of heartburn symptoms may mean that you have a condition called gastroesophageal reflux disease (GERD). What are the causes? This condition is caused by a weakness in the opening (hiatus) where the esophagus passes through the diaphragm to attach to the upper part of the stomach. A person may be born with a weakness in the hiatus, or a weakness can develop over time. What increases the risk? This condition is more likely to develop in: Older people. Age is a major risk factor for a hiatal hernia, especially if you are over the age of 74. Pregnant women. People who are overweight. People who have frequent constipation. What are the signs or symptoms? Symptoms of this condition usually develop in the form of GERD symptoms. Symptoms include: Heartburn. Upset stomach (indigestion). Trouble swallowing. Coughing or wheezing. Wheezing is making high-pitched whistling sounds when you breathe. Sore throat. Chest pain. Nausea and vomiting. How is this diagnosed? This condition may be diagnosed during testing for GERD. Tests that may be done include: X-rays of your stomach or chest. An upper gastrointestinal (GI) series. This is an X-ray exam of your GI tract that is taken after you swallow a chalky liquid that shows up clearly on the X-ray. Endoscopy. This is a  procedure to look into your stomach using a thin, flexible tube that has a tiny camera and light on the end of it. How is this treated? This condition may be treated by: Dietary and lifestyle changes to help reduce GERD symptoms. Medicines. These may include: Over-the-counter antacids. Medicines that make your stomach empty more quickly. Medicines that block the production of stomach acid (H2 blockers). Stronger medicines to reduce stomach acid (proton pump inhibitors). Surgery to repair the hernia, if other treatments are not helping. If you have no symptoms, you may not need treatment. Follow these instructions at home: Lifestyle and activity Do not use any products that contain nicotine or tobacco. These products include cigarettes, chewing tobacco, and vaping devices, such as e-cigarettes. If you need help quitting, ask your health care provider. Try to achieve and maintain a healthy body weight. Avoid putting pressure on your abdomen. Anything that puts pressure on your abdomen increases the amount of acid that may be pushed up into your esophagus. Avoid bending over, especially after eating. Raise the head of your bed by putting blocks under the legs. This keeps your head and esophagus higher than your stomach. Do not wear tight clothing around your chest or stomach. Try not to strain when having a bowel movement, when urinating, or when lifting heavy objects. Eating and drinking Avoid foods that can worsen GERD symptoms. These may include: Fatty foods, like fried foods. Citrus fruits, like oranges or lemon. Other foods and drinks that contain acid, like orange juice or tomatoes. Spicy food. Chocolate. Eat frequent small meals instead of three large meals a  day. This helps prevent your stomach from getting too full. Eat slowly. Do not lie down right after eating. Do not eat 1-2 hours before bed. Do not drink beverages with caffeine. These include cola, coffee, cocoa, and tea. Do  not drink alcohol. General instructions Take over-the-counter and prescription medicines only as told by your health care provider. Keep all follow-up visits. Your health care provider will want to check that any new prescribed medicines are helping your symptoms. Contact a health care provider if: Your symptoms are not controlled with medicines or lifestyle changes. You are having trouble swallowing. You have coughing or wheezing that will not go away. Your pain is getting worse. Your pain spreads to your arms, neck, jaw, teeth, or back. You feel nauseous or you vomit. Get help right away if: You have shortness of breath. You vomit blood. You have bright red blood in your stools. You have black, tarry stools. These symptoms may be an emergency. Get help right away. Call 911. Do not wait to see if the symptoms will go away. Do not drive yourself to the hospital. Summary A hiatal hernia occurs when part of the stomach slides above the muscle that separates the abdomen from the chest. A person may be born with a weakness in the hiatus, or a weakness can develop over time. Symptoms of a hiatal hernia may include heartburn, trouble swallowing, or sore throat. Management of a hiatal hernia includes eating frequent small meals instead of three large meals a day. Get help right away if you vomit blood, have bright red blood in your stools, or have black, tarry stools. This information is not intended to replace advice given to you by your health care provider. Make sure you discuss any questions you have with your health care provider. Document Revised: 03/30/2021 Document Reviewed: 03/30/2021 Elsevier Patient Education  2024 [employer].

## 2023-06-06 NOTE — Progress Notes (Signed)
 Established Patient Office Visit  Subjective   Patient ID: Elizabeth Pruitt, female    DOB: 11/14/1960  Age: 63 y.o. MRN: [id number]  Chief Complaint  Patient presents with   Abdominal Pain    Epigastric pain onset 3 months, with nausea, pt would like xray and referral to gastro    HPI Pt is a 63 yo female with hx of GERD who presents to the clinic with worsening epigastric symptoms for the last 3 months. She denies any medication changes. She does not take NSAIDs regularly and does not drink alcohol regularly. She has not changed her diet by choice but she is having to eat different because of the abdominal fullness, nausea, and indigestion. She can only eat a very little amount without getting sick or having too much discomfort. She is having a lot more indigestion at night. She is taking Axid  without little relief she has also tried other medications over the counter with no benefit. She denies any vomiting, diarrhea, constipation, hematochezia, or melena.    ROS See HPI   Objective:     BP 128/86   Pulse 68   Ht 5' (1.524 m)   Wt 98 lb (44.5 kg)   SpO2 100%   BMI 19.14 kg/m  BP Readings from Last 3 Encounters:  06/06/23 128/86  12/13/22 119/82  12/05/22 (!) 144/96   Wt Readings from Last 3 Encounters:  06/06/23 98 lb (44.5 kg)  12/13/22 103 lb 12 oz (47.1 kg)  12/05/22 102 lb 12 oz (46.6 kg)      Physical Exam Constitutional:      Appearance: She is well-developed.  HENT:     Head: Normocephalic.  Abdominal:     General: Bowel sounds are normal. There is no distension.     Palpations: Abdomen is soft. There is no mass.     Tenderness: There is abdominal tenderness in the epigastric area. There is no right CVA tenderness, left CVA tenderness, guarding or rebound. Negative signs include Murphy's sign, Rovsing's sign and McBurney's sign.     Hernia: No hernia is present.  Neurological:     Mental Status: She is alert and oriented to person, place, and time.   Psychiatric:        Mood and Affect: Mood normal.      The 10-year ASCVD risk score (Arnett DK, et al., 2019) is: 4.6%    Assessment & Plan:  Elizabeth Pruitt was seen today for abdominal pain.  Diagnoses and all orders for this visit:  Epigastric pain -     omeprazole  (PRILOSEC) 40 MG capsule; Take 1 capsule (40 mg total) by mouth 2 (two) times daily. -     Lipase -     CMP14+EGFR -     CBC w/Diff/Platelet -     US  Abdomen Complete; Future -     Ambulatory referral to Gastroenterology  Nausea -     omeprazole  (PRILOSEC) 40 MG capsule; Take 1 capsule (40 mg total) by mouth 2 (two) times daily. -     Lipase -     CMP14+EGFR -     CBC w/Diff/Platelet -     US  Abdomen Complete; Future -     Ambulatory referral to Gastroenterology  Abdominal fullness -     omeprazole  (PRILOSEC) 40 MG capsule; Take 1 capsule (40 mg total) by mouth 2 (two) times daily. -     Lipase -     CMP14+EGFR -     CBC w/Diff/Platelet -  US  Abdomen Complete; Future -     Ambulatory referral to Gastroenterology  Gastroesophageal reflux disease, unspecified whether esophagitis present -     omeprazole  (PRILOSEC) 40 MG capsule; Take 1 capsule (40 mg total) by mouth 2 (two) times daily. -     Lipase -     CMP14+EGFR -     CBC w/Diff/Platelet -     US  Abdomen Complete; Future -     Ambulatory referral to Gastroenterology   ? Gastritis vs PUD vs GERD vs hiatal hernia vs pancreatitis vs cholecystitis Sounds more like hiatal hernia Abdominal ultrasound and labs ordered for work up Start omeprazole  bid  Referral to GI HO given and encouraged GERD friendly diet with small frequent meals Follow up as needed if symptoms persist or worsen   Sandy Crumb, PA-C

## 2023-06-06 NOTE — Telephone Encounter (Signed)
 Chief Complaint: Epigastric discomfort/pressure Symptoms: see above Frequency: ongoing for 1 year, worsened over last few months Pertinent Negatives: Patient denies pain in back, vomiting, diarrhea Disposition: [] ED /[] Urgent Care (no appt availability in office) / [x] Appointment(In office/virtual)/ []  [facility]/ [] Home Care/ [] Refused Recommended Disposition /[] [facility]/ []  Follow-up with PCP Additional Notes: patient returning call from clinic in regard to her recent attempt to schedule an appointment for follow up of abdominal discomfort. Patient states she was seen roughly 6 months ago for her epigastric pressure and was prescribed a medication that is not helping. Patient had messaged office and was encouraged to make an appt for further imaging/labs.    Reason for Disposition  [1] MILD-MODERATE pain AND [2] not relieved by antacid medicine  Answer Assessment - Initial Assessment Questions 1. LOCATION: "Where does it hurt?"      Epigastric area 2. RADIATION: "Does the pain shoot anywhere else?" (e.g., chest, back)     No 3. ONSET: "When did the pain begin?" (e.g., minutes, hours or days ago)      1 year ago 4. SUDDEN: "Gradual or sudden onset?"     Gradual 5. PATTERN "Does the pain come and go, or is it constant?"    - If it comes and goes: "How long does it last?" "Do you have pain now?"     (Note: Comes and goes means the pain is intermittent. It goes away completely between bouts.)    - If constant: "Is it getting better, staying the same, or getting worse?"      (Note: Constant means the pain never goes away completely; most serious pain is constant and gets worse.)      Patient states it has been constant for last several months 6. SEVERITY: "How bad is the pain?"  (e.g., Scale 1-10; mild, moderate, or severe)    - MILD (1-3): Doesn't interfere with normal activities, abdomen soft and not tender to touch..     - MODERATE (4-7): Interferes with normal  activities or awakens from sleep, abdomen tender to touch.     - SEVERE (8-10): Excruciating pain, doubled over, unable to do any normal activities.       Feels very pressure and swollen 7. RECURRENT SYMPTOM: "Have you ever had this type of stomach pain before?" If Yes, ask: "When was the last time?" and "What happened that time?"      Yes for 1 year 8. AGGRAVATING FACTORS: "Does anything seem to cause this pain?" (e.g., foods, stress, alcohol)     N/a - around the clock 9. CARDIAC SYMPTOMS: "Do you have any of the following symptoms: chest pain, difficulty breathing, sweating, nausea?"     No 10. OTHER SYMPTOMS: "Do you have any other symptoms?" (e.g., back pain, diarrhea, fever, urination pain, vomiting)       Been experiencing chills when stomach condition worsens, intermittent nausea  Protocols used: Abdominal Pain - Upper-A-AH

## 2023-06-07 ENCOUNTER — Encounter: Payer: Self-pay | Admitting: Physician Assistant

## 2023-06-07 LAB — CBC WITH DIFFERENTIAL/PLATELET
Basophils Absolute: 0.1 10*3/uL (ref 0.0–0.2)
Basos: 1 %
EOS (ABSOLUTE): 0 10*3/uL (ref 0.0–0.4)
Eos: 1 %
Hematocrit: 38.8 % (ref 34.0–46.6)
Hemoglobin: 13 g/dL (ref 11.1–15.9)
Immature Grans (Abs): 0 10*3/uL (ref 0.0–0.1)
Immature Granulocytes: 0 %
Lymphocytes Absolute: 2.1 10*3/uL (ref 0.7–3.1)
Lymphs: 37 %
MCH: 30.6 pg (ref 26.6–33.0)
MCHC: 33.5 g/dL (ref 31.5–35.7)
MCV: 91 fL (ref 79–97)
Monocytes Absolute: 0.4 10*3/uL (ref 0.1–0.9)
Monocytes: 6 %
Neutrophils Absolute: 3.1 10*3/uL (ref 1.4–7.0)
Neutrophils: 55 %
Platelets: 503 10*3/uL — ABNORMAL HIGH (ref 150–450)
RBC: 4.25 x10E6/uL (ref 3.77–5.28)
RDW: 12.2 % (ref 11.7–15.4)
WBC: 5.6 10*3/uL (ref 3.4–10.8)

## 2023-06-07 LAB — CMP14+EGFR
ALT: 8 IU/L (ref 0–32)
AST: 17 IU/L (ref 0–40)
Albumin: 4.5 g/dL (ref 3.9–4.9)
Alkaline Phosphatase: 66 IU/L (ref 44–121)
BUN/Creatinine Ratio: 20 (ref 12–28)
BUN: 12 mg/dL (ref 8–27)
Bilirubin Total: 0.3 mg/dL (ref 0.0–1.2)
CO2: 23 mmol/L (ref 20–29)
Calcium: 9.6 mg/dL (ref 8.7–10.3)
Chloride: 104 mmol/L (ref 96–106)
Creatinine, Ser: 0.61 mg/dL (ref 0.57–1.00)
Globulin, Total: 2.3 g/dL (ref 1.5–4.5)
Glucose: 93 mg/dL (ref 70–99)
Potassium: 4.3 mmol/L (ref 3.5–5.2)
Sodium: 139 mmol/L (ref 134–144)
Total Protein: 6.8 g/dL (ref 6.0–8.5)
eGFR: 100 mL/min/{1.73_m2} (ref >59)

## 2023-06-07 LAB — LIPASE: Lipase: 45 U/L (ref 14–72)

## 2023-06-07 NOTE — Telephone Encounter (Signed)
 Patient seen in office yesterday by Sandy Crumb, PA

## 2023-06-07 NOTE — Progress Notes (Signed)
 Kidney, liver, pancreas, hemoglobin all look good! Treatment plan stays the same.

## 2023-06-12 ENCOUNTER — Ambulatory Visit

## 2023-06-12 ENCOUNTER — Encounter: Payer: Self-pay | Admitting: Physician Assistant

## 2023-06-12 DIAGNOSIS — R1013 Epigastric pain: Secondary | ICD-10-CM

## 2023-06-12 DIAGNOSIS — K219 Gastro-esophageal reflux disease without esophagitis: Secondary | ICD-10-CM

## 2023-06-12 DIAGNOSIS — R198 Other specified symptoms and signs involving the digestive system and abdomen: Secondary | ICD-10-CM

## 2023-06-12 DIAGNOSIS — R110 Nausea: Secondary | ICD-10-CM

## 2023-06-12 DIAGNOSIS — R140 Abdominal distension (gaseous): Secondary | ICD-10-CM

## 2023-06-12 NOTE — Progress Notes (Signed)
 Elizabeth Pruitt,   Gallbladder looks great. Liver normal.  No abnormalities on ultrasound.

## 2023-06-22 ENCOUNTER — Encounter: Payer: Self-pay | Admitting: Family Medicine

## 2023-07-18 ENCOUNTER — Encounter: Payer: Self-pay | Admitting: Physician Assistant

## 2023-07-19 ENCOUNTER — Telehealth: Payer: Self-pay | Admitting: Family Medicine

## 2023-07-19 ENCOUNTER — Encounter: Payer: Self-pay | Admitting: Family Medicine

## 2023-07-19 DIAGNOSIS — A048 Other specified bacterial intestinal infections: Secondary | ICD-10-CM | POA: Insufficient documentation

## 2023-07-19 NOTE — Telephone Encounter (Signed)
 Call pt: needs mammo and DEXA. We can schedule here in our building in our imagng dept.

## 2023-07-19 NOTE — Telephone Encounter (Signed)
 Left message for a return call

## 2023-08-25 NOTE — Telephone Encounter (Signed)
 We try to call her 1 more time or send her letter.

## 2023-08-26 ENCOUNTER — Other Ambulatory Visit: Payer: Self-pay | Admitting: Physician Assistant

## 2023-08-26 DIAGNOSIS — R1013 Epigastric pain: Secondary | ICD-10-CM

## 2023-08-26 DIAGNOSIS — K219 Gastro-esophageal reflux disease without esophagitis: Secondary | ICD-10-CM

## 2023-08-26 DIAGNOSIS — R198 Other specified symptoms and signs involving the digestive system and abdomen: Secondary | ICD-10-CM

## 2023-08-26 DIAGNOSIS — R110 Nausea: Secondary | ICD-10-CM

## 2023-08-28 NOTE — Telephone Encounter (Signed)
 Left message for a return call. Also sent MyChart message.

## 2023-08-29 NOTE — Telephone Encounter (Signed)
 Patient declines scheduling MMG or bone density for now. She will give our office a call once she decides to schedule these.

## 2023-09-05 ENCOUNTER — Encounter: Admitting: Urgent Care

## 2023-09-20 ENCOUNTER — Ambulatory Visit (INDEPENDENT_AMBULATORY_CARE_PROVIDER_SITE_OTHER): Admitting: Urgent Care

## 2023-09-20 ENCOUNTER — Encounter: Payer: Self-pay | Admitting: Urgent Care

## 2023-09-20 VITALS — BP 137/95 | HR 88 | Resp 20 | Ht 60.0 in | Wt 96.0 lb

## 2023-09-20 DIAGNOSIS — R634 Abnormal weight loss: Secondary | ICD-10-CM | POA: Diagnosis not present

## 2023-09-20 DIAGNOSIS — R6889 Other general symptoms and signs: Secondary | ICD-10-CM | POA: Diagnosis not present

## 2023-09-20 DIAGNOSIS — Z0000 Encounter for general adult medical examination without abnormal findings: Secondary | ICD-10-CM

## 2023-09-20 NOTE — Progress Notes (Unsigned)
 Complete physical exam  Patient: Elizabeth Pruitt   DOB: 05/27/1960   63 y.o. Female  MRN: [id number]  Subjective:    Chief Complaint  Patient presents with   Annual Exam    Elizabeth Pruitt is a 63 y.o. female who presents today for a complete physical exam. She reports consuming a 80% vegetarian diet. Active lifestyle, primarily through chores around the house. No formal exercise routine. She generally feels fairly well. She reports sleeping well. She does have additional problems to discuss today.   Discussed the use of AI scribe software for clinical note transcription with the patient, who gave verbal consent to proceed.  History of Present Illness   Elizabeth Pruitt is a 63 year old female who presents with persistent cold intolerance and weight loss.  She has experienced persistent cold intolerance for over a decade, which has progressively worsened. She feels cold even in warm environments if air conditioning is present, necessitating the maintenance of her home temperature at 79 degrees. Cold sweats occur when she feels cold, resulting in her entire body becoming wet. Despite wearing extra clothing, her symptoms have not improved.  She has undergone thyroid  testing in the past, which returned normal results, but her symptoms have continued to worsen. Her family and friends, including a nurse, have suggested more extensive thyroid  testing due to her symptoms and age.  She has been losing weight unintentionally, which she attributes to stress related to her cold intolerance. Despite this, she maintains a good appetite and reports no specific pain or chronic conditions. She follows a predominantly vegetarian diet but does consume some meat. She engages in various physical activities, including housework, cooking, and gardening, and reports having a good amount of energy.  She was diagnosed with Raynaud's syndrome in 2017, but she has not received any specific treatment for it. She does not experience color  changes in her extremities, such as purplish or white fingers. She manages her symptoms by wearing layers and avoiding cold environments.  No symptoms of Sjogren's syndrome, such as dry eyes or dry mouth, despite it being mentioned by her husband. She is sensitive to caffeine and sugar, which affects her sleep, and she avoids these substances to maintain good sleep quality.  She is a former smoker, having quit 25 years ago after smoking for about 10 years. She does not consume alcohol and has declined vaccinations, including shingles and tetanus. She has undergone a Pap smear and mammogram approximately two years ago, with normal results, but there is no record of a colonoscopy.       Most recent fall risk assessment:     No data to display           Most recent depression screenings:    12/05/2022   10:18 AM 08/08/2018    9:31 AM  PHQ 2/9 Scores  PHQ - 2 Score 2 0  PHQ- 9 Score 3     Vision:Not within last year  and Dental: No current dental problems and Receives regular dental care  Patient Active Problem List   Diagnosis Date Noted   H. 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 Nathanel Grain ENT 11/23/2011   Past Surgical History:  Procedure Laterality Date   CESAREAN SECTION     TONSILLECTOMY     Social History   Tobacco Use   Smoking status: Former    Current packs/day: 0.00    Types: Cigarettes    Quit date: 02/14/1998    Years since quitting: 25.6   Smokeless tobacco: Never  Vaping Use   Vaping status: Never Used  Substance Use Topics   Alcohol use: No   Drug use: No      Patient Care Team: Lowella Benton CROME, [place] as PCP - General (Physician Assistant) Grain Nathanel, MD as Attending Physician (Otolaryngology)   Outpatient Medications Prior to Visit  Medication Sig   EPIPEN  2-PAK 0.3 MG/0.3ML SOAJ injection Inject 0.3 mg into the muscle as needed for anaphylaxis.   fluocinonide -emollient (LIDEX -E) 0.05 % cream Apply 1 Application topically 2 (two) times daily.   omeprazole  (PRILOSEC) 40 MG capsule TAKE 1 CAPSULE BY MOUTH TWICE A DAY   [DISCONTINUED] nizatidine  (AXID ) 150 MG capsule TAKE 1 CAPSULE BY MOUTH EVERY DAY   No facility-administered medications prior to visit.    ROS Complete 12 point ROS performed with all pertinent positives listed in HPI      Objective:     BP (!) 137/95 (BP Location: Left Arm, Patient Position: Sitting, Cuff Size: Small)   Pulse 88   Resp 20   Ht 5' (1.524 m)   Wt 96 lb (43.5 kg)   SpO2 97%   BMI 18.75 kg/m  BP Readings from Last 3 Encounters:  09/20/23 (!) 137/95  06/06/23 128/86  12/13/22 119/82   Wt Readings from Last 3 Encounters:  09/20/23 96 lb (43.5 kg)  06/06/23 98 lb (44.5 kg)  12/13/22 103 lb 12 oz (47.1 kg)      Physical Exam Vitals and nursing note reviewed.  Constitutional:      General: She is not in acute distress.    Appearance: Normal appearance. She is not ill-appearing, toxic-appearing or diaphoretic.  HENT:     Head: Normocephalic and atraumatic.     Right Ear: Tympanic membrane, ear canal and external ear normal. There is no impacted cerumen.     Left  Ear: Tympanic membrane, ear canal and external ear normal. There is no impacted cerumen.     Nose: Nose normal.     Mouth/Throat:     Mouth: Mucous membranes are moist.     Pharynx: Oropharynx is clear. No oropharyngeal exudate or posterior oropharyngeal erythema.  Eyes:     General: No scleral icterus.       Right eye: No discharge.        Left eye: No discharge.     Extraocular Movements: Extraocular movements intact.     Pupils: Pupils are equal, round, and reactive to light.  Neck:     Thyroid : No thyroid  mass, thyromegaly or thyroid  tenderness.  Cardiovascular:     Rate and Rhythm: Normal rate and regular rhythm.     Pulses: Normal pulses.     Heart  sounds: No murmur heard. Pulmonary:     Effort: Pulmonary effort is normal. No respiratory distress.     Breath sounds: Normal breath sounds. No stridor. No wheezing or rhonchi.  Abdominal:     General: Abdomen is flat. Bowel sounds are normal. There is no distension.     Palpations: Abdomen is soft. There is no mass.     Tenderness: There is no abdominal tenderness. There is no guarding.  Musculoskeletal:     Cervical back: Normal range of motion and neck supple. No rigidity or tenderness.     Right lower leg: No edema.     Left lower leg: No edema.  Lymphadenopathy:     Cervical: No cervical adenopathy.  Skin:    General: Skin is warm and dry.     Coloration: Skin is not jaundiced.     Findings: No bruising, erythema or rash.  Neurological:     General: No focal deficit present.     Mental Status: She is alert and oriented to person, place, and time.     Sensory: No sensory deficit.     Motor: No weakness.  Psychiatric:        Mood and Affect: Mood normal.        Behavior: Behavior normal.          Assessment & Plan:    Routine Health Maintenance and Physical Exam  Immunization History  Administered Date(s) Administered   Influenza-Unspecified 11/14/2018    Health Maintenance  Topic Date Due   Colonoscopy   Never done   MAMMOGRAM  04/25/2015   COVID-19 Vaccine (1 - 2024-25 season) Never done   DEXA SCAN  Never done   INFLUENZA VACCINE  09/15/2023   Zoster Vaccines- Shingrix (1 of 2) 12/21/2023 (Originally 07/17/2008)   DTaP/Tdap/Td (1 - Tdap) 09/19/2024 (Originally 07/17/1977)   Pneumococcal Vaccine: 50+ Years (1 of 1 - PCV) 09/19/2024 (Originally 07/17/2008)   Cervical Cancer Screening (HPV/Pap Cotest)  09/24/2025   Hepatitis C Screening  Completed   HIV Screening  Completed   Hepatitis B Vaccines  Aged Out   HPV VACCINES  Aged Out   Meningococcal B Vaccine  Aged Out    Discussed health benefits of physical activity, and encouraged her to engage in regular exercise appropriate for her age and condition.  Problem List Items Addressed This Visit     Routine adult health maintenance - Primary   Relevant Orders   TSH+T4F+T3Free+ThyAbs+TPO+VD25 (Completed)   Thyroid  stimulating immunoglobulin (Completed)   Iron, TIBC and Ferritin Panel (Completed)   B12 and Folate Panel (Completed)   ANA w/Reflex (Completed)   CBC w/Diff/Platelet (Completed)   CMP14+EGFR (Completed)   Vitamin D  (25 hydroxy)   Lipid panel (Completed)   HgB A1c (Completed)   Other Visit Diagnoses       Cold feeling       Relevant Orders   TSH+T4F+T3Free+ThyAbs+TPO+VD25 (Completed)   Thyroid  stimulating immunoglobulin (Completed)   Iron, TIBC and Ferritin Panel (Completed)   B12 and Folate Panel (Completed)   ANA w/Reflex (Completed)   CBC w/Diff/Platelet (Completed)   CMP14+EGFR (Completed)   Vitamin D  (25 hydroxy)   HgB A1c (Completed)   Cold Agglutinin Titer (Completed)   Iodine, Serum/Plasma     Unintentional weight loss       Relevant Orders   TSH+T4F+T3Free+ThyAbs+TPO+VD25 (Completed)   Thyroid  stimulating immunoglobulin (Completed)   Iron, TIBC and Ferritin Panel (Completed)   ANA w/Reflex (Completed)   CBC w/Diff/Platelet (Completed)   CMP14+EGFR (  Completed)   HgB A1c (Completed)   Cold Agglutinin  Titer (Completed)      Return in about 4 weeks (around 10/18/2023).  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 Differential includes thyroid  dysfunction and autoimmune conditions. Raynaud's phenomenon unlikely due to absence of color changes. She requested thyroid  evaluation. - Order comprehensive thyroid  panel. - Order autoimmune lab tests. - Order iron studies. - Order B12 level.  Raynaud's phenomenon Diagnosed in 2017 with vasoconstriction and cold extremities. Symptoms lack color changes, suggesting incomplete presentation. Amlodipine deferred pending further evaluation. - Continue management with avoidance of cold and wearing layers. - Consider further evaluation if symptoms persist or worsen.  Gastroesophageal reflux disease Symptoms managed with omeprazole . Recent antibiotic treatment for bacterial stomach infection. Omeprazole  effective. - Continue omeprazole  as prescribed.  General Health Maintenance Declines vaccines including shingles and tetanus. Reports normal Pap smear and mammogram, but records unavailable. Colonoscopy not performed. - Sign record release for mammogram and bone density results.          Benton LITTIE Gave, PA

## 2023-09-20 NOTE — Patient Instructions (Addendum)
 We drew your labs today for your annual wellness visit and to address your concerns.  We will contact you with the results when they have all processed.  Please sign a record release so that we can obtain your mammogram and dexa scan results.  Please return in one month for follow up and monitor your weight.

## 2023-09-21 ENCOUNTER — Ambulatory Visit: Payer: Self-pay | Admitting: Urgent Care

## 2023-09-21 DIAGNOSIS — R6889 Other general symptoms and signs: Secondary | ICD-10-CM

## 2023-09-21 DIAGNOSIS — R634 Abnormal weight loss: Secondary | ICD-10-CM

## 2023-09-21 LAB — CMP14+EGFR
ALT: 7 IU/L (ref 0–32)
AST: 18 IU/L (ref 0–40)
Albumin: 4.7 g/dL (ref 3.9–4.9)
Alkaline Phosphatase: 66 IU/L (ref 44–121)
BUN/Creatinine Ratio: 23 (ref 12–28)
BUN: 16 mg/dL (ref 8–27)
Bilirubin Total: 0.4 mg/dL (ref 0.0–1.2)
CO2: 22 mmol/L (ref 20–29)
Calcium: 9.8 mg/dL (ref 8.7–10.3)
Chloride: 105 mmol/L (ref 96–106)
Creatinine, Ser: 0.71 mg/dL (ref 0.57–1.00)
Globulin, Total: 2.5 g/dL (ref 1.5–4.5)
Glucose: 94 mg/dL (ref 70–99)
Potassium: 4.6 mmol/L (ref 3.5–5.2)
Sodium: 142 mmol/L (ref 134–144)
Total Protein: 7.2 g/dL (ref 6.0–8.5)
eGFR: 94 mL/min/1.73 (ref >59)

## 2023-09-21 LAB — CBC WITH DIFFERENTIAL/PLATELET
Basophils Absolute: 0.1 x10E3/uL (ref 0.0–0.2)
Basos: 1 %
EOS (ABSOLUTE): 0.2 x10E3/uL (ref 0.0–0.4)
Eos: 5 %
Hematocrit: 42.8 % (ref 34.0–46.6)
Hemoglobin: 14 g/dL (ref 11.1–15.9)
Immature Grans (Abs): 0 x10E3/uL (ref 0.0–0.1)
Immature Granulocytes: 0 %
Lymphocytes Absolute: 2.4 x10E3/uL (ref 0.7–3.1)
Lymphs: 48 %
MCH: 30.5 pg (ref 26.6–33.0)
MCHC: 32.7 g/dL (ref 31.5–35.7)
MCV: 93 fL (ref 79–97)
Monocytes Absolute: 0.3 x10E3/uL (ref 0.1–0.9)
Monocytes: 5 %
Neutrophils Absolute: 2.1 x10E3/uL (ref 1.4–7.0)
Neutrophils: 41 %
Platelets: 323 x10E3/uL (ref 150–450)
RBC: 4.59 x10E6/uL (ref 3.77–5.28)
RDW: 12.1 % (ref 11.7–15.4)
WBC: 5 x10E3/uL (ref 3.4–10.8)

## 2023-09-21 LAB — TSH+T4F+T3FREE+THYABS+TPO+VD25
Free T4: 1.32 ng/dL (ref 0.82–1.77)
T3, Free: 2.8 pg/mL (ref 2.0–4.4)
TSH: 1.32 u[IU]/mL (ref 0.450–4.500)
Thyroglobulin Antibody: <1 [IU]/mL (ref 0.0–0.9)
Thyroperoxidase Ab SerPl-aCnc: 17 [IU]/mL (ref 0–34)
Vit D, 25-Hydroxy: 31.2 ng/mL (ref 30.0–100.0)

## 2023-09-21 LAB — COLD AGGLUTININ TITER: Cold Agglutinin Titer: NEGATIVE

## 2023-09-21 LAB — THYROID STIMULATING IMMUNOGLOBULIN: Thyroid Stim Immunoglobulin: <0.1 IU/L (ref 0.00–0.55)

## 2023-09-21 LAB — IRON,TIBC AND FERRITIN PANEL
Ferritin: 31 ng/mL (ref 15–150)
Iron Saturation: 35 % (ref 15–55)
Iron: 114 ug/dL (ref 27–139)
Total Iron Binding Capacity: 322 ug/dL (ref 250–450)
UIBC: 208 ug/dL (ref 118–369)

## 2023-09-21 LAB — IODINE, SERUM/PLASMA

## 2023-09-21 LAB — LIPID PANEL
Chol/HDL Ratio: 2.8 ratio (ref 0.0–4.4)
Cholesterol, Total: 239 mg/dL — ABNORMAL HIGH (ref 100–199)
HDL: 84 mg/dL (ref >39)
LDL Chol Calc (NIH): 141 mg/dL — ABNORMAL HIGH (ref 0–99)
Triglycerides: 82 mg/dL (ref 0–149)
VLDL Cholesterol Cal: 14 mg/dL (ref 5–40)

## 2023-09-21 LAB — B12 AND FOLATE PANEL
Folate: 9.8 ng/mL (ref >3.0)
Vitamin B-12: 429 pg/mL (ref 232–1245)

## 2023-09-21 LAB — ANA W/REFLEX: Anti Nuclear Antibody (ANA): NEGATIVE

## 2023-09-21 LAB — HEMOGLOBIN A1C
Est. average glucose Bld gHb Est-mCnc: 114 mg/dL
Hgb A1c MFr Bld: 5.6 % (ref 4.8–5.6)

## 2023-09-28 ENCOUNTER — Telehealth: Payer: Self-pay

## 2023-09-28 DIAGNOSIS — E559 Vitamin D deficiency, unspecified: Secondary | ICD-10-CM

## 2023-09-28 DIAGNOSIS — R6889 Other general symptoms and signs: Secondary | ICD-10-CM

## 2023-09-28 NOTE — Telephone Encounter (Signed)
 Attempted call back to patient. Left a voice mail message that I was returning her call and requesting that she return our call. Left return contact information.

## 2023-09-28 NOTE — Telephone Encounter (Signed)
 Spoke with patient - requesting to have blood work orders for Iodine due to sowing contaminated as well as Vit D that had not resulted  O.k. reorder Iodine level recheck?

## 2023-09-28 NOTE — Telephone Encounter (Signed)
 Yes that is fine  thanks

## 2023-09-28 NOTE — Telephone Encounter (Signed)
 Added lab work to patient chart. Called patient and left a voice mail message that she could stop by for the labwork to be drawn at her convenience.

## 2023-09-28 NOTE — Telephone Encounter (Signed)
 Copied from CRM #[id number]. Topic: Clinical - Lab/Test Results >> Sep 27, 2023  3:52 PM Elizabeth Pruitt ORN wrote: Reason for CRM: Patient is requesting for a nurse to call her in regards to blood test that she had completed last week. CB #: [id number].

## 2023-10-03 ENCOUNTER — Ambulatory Visit (INDEPENDENT_AMBULATORY_CARE_PROVIDER_SITE_OTHER)

## 2023-10-03 DIAGNOSIS — R634 Abnormal weight loss: Secondary | ICD-10-CM

## 2023-10-03 DIAGNOSIS — R6889 Other general symptoms and signs: Secondary | ICD-10-CM

## 2023-10-05 LAB — IODINE, SERUM/PLASMA: Iodine: 70 ug/L — AB (ref 31.1–64.6)

## 2023-10-05 LAB — VITAMIN D 25 HYDROXY (VIT D DEFICIENCY, FRACTURES): Vit D, 25-Hydroxy: 34 ng/mL (ref 30.0–100.0)

## 2023-10-09 ENCOUNTER — Telehealth: Payer: Self-pay

## 2023-10-09 NOTE — Telephone Encounter (Signed)
 Copied from CRM [id number]. Topic: Clinical - Lab/Test Results >> Oct 09, 2023  1:33 PM Hamdi H wrote: Reason for CRM: Patient would like to speak with a nurse about her lab results. Best call back number is [phone].

## 2023-10-09 NOTE — Telephone Encounter (Signed)
Results not yet reviewed by provider

## 2023-10-10 ENCOUNTER — Other Ambulatory Visit: Payer: Self-pay | Admitting: Urgent Care

## 2023-10-10 ENCOUNTER — Ambulatory Visit: Payer: Self-pay | Admitting: Urgent Care

## 2023-10-10 DIAGNOSIS — R112 Nausea with vomiting, unspecified: Secondary | ICD-10-CM

## 2023-10-10 DIAGNOSIS — R634 Abnormal weight loss: Secondary | ICD-10-CM

## 2023-10-10 NOTE — Telephone Encounter (Signed)
 Called patient and left a detailed voicemail message on home # ( patient gave verbal permission to do so during previous conversation today)

## 2023-10-10 NOTE — Telephone Encounter (Signed)
 I had to discuss her results with Dr. Alvia. Iodine levels were drawn per pt request. In general, iodine is not really of concern unless it is leading to thyroid  issues. All of patients thyroid  labs were normal. I suspect iodine level may be elevated if she is consuming large amounts of iodize salt. I do not feel that this level is contributory to the symptoms discussed in office.

## 2023-10-10 NOTE — Telephone Encounter (Signed)
 Spoke with patient - feels that she has iodine poisoning- feels like she is dyeing  and like she is going to collapse She has stopped using Iodized salt about 2 days ago She is requesting activated charcoal treatment due to symptoms-  Metallic taste, chills, nausea, diarrhea,  shocking /burning sensation, abdominal pain  Symptoms worse in the last 3 months She is requesting that Benton Gave look this up and get back to her asap-  Also just FYI-She states she eats a lot kimchee and seafood, hot dogs in diet.

## 2023-10-10 NOTE — Telephone Encounter (Signed)
 If patient feels like she is dying, she should likely seek ER consultation.  On an outpatient basis however, our workup thus far has been rather unremarkable. I will place a referral to GI for her to get her symptoms further assessed.

## 2023-10-13 ENCOUNTER — Ambulatory Visit: Payer: Self-pay | Admitting: Urgent Care

## 2023-10-18 ENCOUNTER — Encounter: Payer: Self-pay | Admitting: Urgent Care

## 2023-10-18 ENCOUNTER — Ambulatory Visit (INDEPENDENT_AMBULATORY_CARE_PROVIDER_SITE_OTHER): Admitting: Urgent Care

## 2023-10-18 VITALS — BP 121/80 | HR 84 | Resp 20 | Ht 60.0 in | Wt 95.0 lb

## 2023-10-18 DIAGNOSIS — R6889 Other general symptoms and signs: Secondary | ICD-10-CM

## 2023-10-18 DIAGNOSIS — R634 Abnormal weight loss: Secondary | ICD-10-CM | POA: Diagnosis not present

## 2023-10-18 DIAGNOSIS — R197 Diarrhea, unspecified: Secondary | ICD-10-CM

## 2023-10-18 DIAGNOSIS — Z8619 Personal history of other infectious and parasitic diseases: Secondary | ICD-10-CM | POA: Diagnosis not present

## 2023-10-18 MED ORDER — DIPHENOXYLATE-ATROPINE 2.5-0.025 MG PO TABS: ORAL_TABLET | ORAL | 0 refills | Status: DC

## 2023-10-18 NOTE — Progress Notes (Signed)
 Established Patient Office Visit  Subjective:  Patient ID: Elizabeth Pruitt, female    DOB: 1960/03/21  Age: 63 y.o. MRN: [id number]  Chief Complaint  Patient presents with   Follow-up    Cold intolerance     Diarrhea    HPI  Discussed the use of AI scribe software for clinical note transcription with the patient, who gave verbal consent to proceed.  History of Present Illness   Elizabeth Pruitt is a 63 year old female who presents with unintentional weight loss and gastrointestinal symptoms.  She has experienced unintentional weight loss, dropping from her usual weight of 105 pounds to the nineties. This weight loss began after an illness over the summer, which she attributes to a cold and subsequent indigestion that prevented her from eating. She is concerned about losing more weight as it affects her strength.  She has a history of gastric ulcer diagnosed in the late 1980s and has experienced indigestion problems throughout her life. Recently, she was diagnosed with H. pylori infection by a gastroenterologist and completed a two-week course of two antibiotics, one taken four times a day and the other twice a day. The antibiotics worsened her indigestion and made her feel weak, contributing to her weight loss. She was seen by GI early May and also completed an EGD.  Following the antibiotic treatment, she experienced nausea, diarrhea, and increased sensitivity to cold. The diarrhea occurred on and off for several weeks after completing the antibiotics, with episodes happening every other day. Her cold sensitivity has persisted, and she feels colder than before.  She has been taking omeprazole  twice a day, which helps her eat and digest better, although she still experiences nausea and diarrhea at times. Her indigestion improves with omeprazole , but she continues to have some symptoms.  She has been consuming mineral water regularly, which she believes calms her stomach, but she is concerned about  potential iodine poisoning from the mineral water, as her iodine levels were found to be high  She has tried Imodium AD for diarrhea without relief. She is requesting a refill of Lomotil  which she has taken successfully in the past.     Patient Active Problem List   Diagnosis Date Noted   H. 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 Nathanel Grain ENT 11/23/2011   Past Surgical History:  Procedure Laterality Date   CESAREAN SECTION     TONSILLECTOMY     Social History   Tobacco Use   Smoking status: Former    Current packs/day: 0.00    Types: Cigarettes    Quit date: 02/14/1998    Years since quitting: 25.6   Smokeless tobacco: Never  Vaping Use   Vaping status: Never Used  Substance Use Topics   Alcohol use: No   Drug use: No      ROS: as noted in HPI  Objective:     BP 121/80 (BP Location: Right Arm, Patient Position: Sitting, Cuff Size: Small)   Pulse 84   Resp 20   Ht 5' (1.524 m)   Wt 95 lb (43.1 kg)   SpO2 98%   BMI 18.55 kg/m  BP Readings from Last 3 Encounters:  10/18/23 121/80  09/20/23 (!) 137/95  06/06/23 128/86   Wt Readings from Last 3  Encounters:  10/18/23 95 lb (43.1 kg)  09/20/23 96 lb (43.5 kg)  06/06/23 98 lb (44.5 kg)      Physical Exam Vitals and nursing note reviewed.  Constitutional:      General: She is not in acute distress.    Appearance: Normal appearance. She is not ill-appearing, toxic-appearing or diaphoretic.     Comments: Pt is wearing three sweaters, a thick jacket, two pairs of pants, and a winter hat  HENT:     Head: Normocephalic and atraumatic.  Cardiovascular:     Rate and Rhythm: Normal rate and regular rhythm.     Heart sounds: No murmur heard. Pulmonary:     Effort: Pulmonary effort is normal. No respiratory distress.     Breath sounds: Normal breath sounds. No stridor. No wheezing or rhonchi.  Abdominal:     General: Abdomen is flat. Bowel sounds are normal. There is no distension.     Palpations: Abdomen is soft.     Tenderness: There is no abdominal tenderness. There is no right CVA tenderness, left CVA tenderness, guarding or rebound.  Musculoskeletal:     Cervical back: No tenderness.     Right lower leg: No edema.     Left lower leg: No edema.  Lymphadenopathy:     Cervical: No cervical adenopathy.  Neurological:     Mental Status: She is alert.      No results found for any visits on 10/18/23.    The 10-year ASCVD risk score (Arnett DK, et al., 2019) is: 4.6%  Assessment & Plan:  Unintentional weight loss  Cold feeling  History of Helicobacter pylori infection -     H. pylori breath test  Diarrhea, unspecified type -     Clostridium Difficile by PCR -     GI Profile, Stool, PCR -     Diphenoxylate -Atropine ; One to 2 tablets by mouth 4 times a day as needed for diarrhea.  Dispense: 20 tablet; Refill: 0  Assessment and Plan    Abnormal weight loss with chronic diarrhea and dyspepsia, evaluating for persistent Helicobacter pylori infection and Clostridioides difficile infection Continued weight loss with nausea, diarrhea, and indigestion. Previous H. pylori  treatment may not have eradicated infection. Potential C. difficile infection due to antibiotic use. Omeprazole  may affect H. pylori test accuracy. - Retest for H. pylori using a breath test after a 5-day cessation of omeprazole . - Test for C. difficile infection using a stool sample. - Prescribe Lomotil  for diarrhea, advising not to take it within two days of providing a stool sample.  History of gastric ulcer with chronic dyspepsia and epigastric discomfort Chronic dyspepsia and epigastric discomfort with a history of gastric ulcer. Omeprazole  effective but must be paused for H. pylori testing. - Instruct to pause omeprazole  for 5 days before H. pylori breath test. - Consider sucralfate for ulcer management if H. pylori test is negative.  No follow-ups on file.   Benton LITTIE Gave, PA

## 2023-10-18 NOTE — Patient Instructions (Addendum)
 Please complete the stool test as requested and bring back at your earliest convenience. Do not take the lomotil  for 2 days prior to your stool test.  Please STOP your omeprazole  for the next 5 days. Return to our office on Monday for a nurse visit only to complete a h pylori breath test.

## 2023-10-19 ENCOUNTER — Telehealth: Payer: Self-pay | Admitting: Urgent Care

## 2023-10-19 NOTE — Telephone Encounter (Signed)
 Copied from CRM #[id number]. Topic: Clinical - Medication Question >> Oct 19, 2023  2:56 PM Kevelyn M wrote: Reason for CRM: A month ago for her physical, she asked if Whitney could fax a paper from her employer once she filled it out. They have not received it yet. She's asking if she still has the paper and if not she can bring in another one. Patient would like to speak to [facility] [facility].  Call back #[phone]

## 2023-10-19 NOTE — Telephone Encounter (Signed)
 Found a scanned copy in the chart, will re-fax.

## 2023-10-19 NOTE — Telephone Encounter (Signed)
 Any papers provided to me I would have completed. I suspect it was given to Tiffany. Can you check chart to see if it was scanned? If not, have her bring in new one. Thanks and sorry for the hassle

## 2023-10-21 ENCOUNTER — Ambulatory Visit: Payer: Self-pay | Admitting: Urgent Care

## 2023-10-21 LAB — GI PROFILE, STOOL, PCR

## 2023-10-21 LAB — CLOSTRIDIUM DIFFICILE BY PCR: Toxigenic C. Difficile by PCR: NEGATIVE

## 2023-10-23 ENCOUNTER — Ambulatory Visit (INDEPENDENT_AMBULATORY_CARE_PROVIDER_SITE_OTHER): Admitting: Urgent Care

## 2023-10-23 ENCOUNTER — Ambulatory Visit

## 2023-10-23 VITALS — BP 123/78 | HR 78 | Ht 60.0 in | Wt 95.0 lb

## 2023-10-23 DIAGNOSIS — R634 Abnormal weight loss: Secondary | ICD-10-CM

## 2023-10-23 DIAGNOSIS — R198 Other specified symptoms and signs involving the digestive system and abdomen: Secondary | ICD-10-CM

## 2023-10-23 DIAGNOSIS — Z8619 Personal history of other infectious and parasitic diseases: Secondary | ICD-10-CM

## 2023-10-23 DIAGNOSIS — R1013 Epigastric pain: Secondary | ICD-10-CM

## 2023-10-23 NOTE — Telephone Encounter (Signed)
 Re-faxed during patient visit on 10/23/23.

## 2023-10-23 NOTE — Progress Notes (Signed)
 Pt was seen here for a nurse visit only to obtain H pylori breath test.

## 2023-10-24 ENCOUNTER — Ambulatory Visit: Payer: Self-pay | Admitting: Urgent Care

## 2023-10-24 LAB — H. PYLORI BREATH TEST: H pylori Breath Test: NEGATIVE

## 2023-10-31 ENCOUNTER — Telehealth: Payer: Self-pay

## 2023-10-31 NOTE — Telephone Encounter (Signed)
 Spoke with patient and refaxed the form once again

## 2023-10-31 NOTE — Telephone Encounter (Signed)
 Copied from CRM [id number]. Topic: General - Other >> Oct 31, 2023  2:52 PM Elizabeth Pruitt wrote: Reason for CRM: Patient would like to speak to Luke or Benton about her husbands paperwork. Could you assist? Patients callback number is [phone]. Patient states this was suppose to be taken care of a week ago. This is her third request for these papers.

## 2023-11-01 ENCOUNTER — Ambulatory Visit: Payer: Self-pay

## 2023-11-01 ENCOUNTER — Other Ambulatory Visit: Payer: Self-pay | Admitting: Medical-Surgical

## 2023-11-01 ENCOUNTER — Encounter: Payer: Self-pay | Admitting: Medical-Surgical

## 2023-11-01 ENCOUNTER — Ambulatory Visit (INDEPENDENT_AMBULATORY_CARE_PROVIDER_SITE_OTHER): Admitting: Medical-Surgical

## 2023-11-01 VITALS — BP 128/87 | HR 79 | Temp 98.3°F | Resp 20 | Ht 60.0 in | Wt 95.0 lb

## 2023-11-01 DIAGNOSIS — J019 Acute sinusitis, unspecified: Secondary | ICD-10-CM

## 2023-11-01 DIAGNOSIS — B9689 Other specified bacterial agents as the cause of diseases classified elsewhere: Secondary | ICD-10-CM

## 2023-11-01 DIAGNOSIS — R197 Diarrhea, unspecified: Secondary | ICD-10-CM

## 2023-11-01 MED ORDER — AZITHROMYCIN 250 MG PO TABS: ORAL_TABLET | ORAL | 0 refills | Status: AC

## 2023-11-01 MED ORDER — DIPHENOXYLATE-ATROPINE 2.5-0.025 MG PO TABS: ORAL_TABLET | ORAL | 0 refills | Status: AC

## 2023-11-01 MED ORDER — SACCHAROMYCES BOULARDII 250 MG PO CAPS: 250.0000 mg | ORAL_CAPSULE | Freq: Two times a day (BID) | ORAL | 5 refills | Status: DC

## 2023-11-01 NOTE — Telephone Encounter (Signed)
 Do we need to cancel this order since it sounds like they gave her the OTC.

## 2023-11-01 NOTE — Telephone Encounter (Signed)
 FYI Only or Action Required?: FYI only for provider.  Patient was last seen in primary care on 10/18/2023 by Lowella Benton CROME, PA.  Called Nurse Triage reporting Sinusitis.  Symptoms began several weeks ago.  Interventions attempted: OTC medications: Advil and Other: gargling with warm salt water, vinegar, peroxide.  Symptoms are: sinus pain and congestion, sore throat, green and brown mucus, chills gradually worsening.  Triage Disposition: See Today in Office (overriding See PCP When Office is Open (Within 3 Days))  Patient/caregiver understands and will follow disposition?: Yes             Copied from CRM #[id number]. Topic: Clinical - Red Word Triage >> Nov 01, 2023  9:03 AM Miquel SAILOR wrote: Red Word that prompted transfer to Nurse Triage: Sinues infection  symptoms: Headache/Pressure/Sore throat/congestions 3 for 3 weeks but getting worse/ sleeping issue/mucus green brown Reason for Disposition  [1] Sinus congestion (pressure, fullness) AND [2] present > 10 days  Answer Assessment - Initial Assessment Questions 1. LOCATION: Where does it hurt?      Top of head and sinus  2. ONSET: When did the sinus pain start?  (e.g., hours, days)      3 weeks ago.  3. SEVERITY: How bad is the pain?   (Scale 0-10; or none, mild, moderate or severe)     7.5/10, lingers all day.  4. RECURRENT SYMPTOM: Have you ever had sinus problems before? If Yes, ask: When was the last time? and What happened that time?      Yes, she states she gets a lot of sinus infections in the past. She treated with antibiotics.  5. NASAL CONGESTION: Is the nose blocked? If Yes, ask: Can you open it or must you breathe through your mouth?     Nasal congestion, yes and she states it gets blocked up a lot and sometimes the mucus blocks the back of her throat too.  6. NASAL DISCHARGE: Do you have discharge from your nose? If so ask, What color?     Yes, green and brown.  7. FEVER: Do you  have a fever? If Yes, ask: What is it, how was it measured, and when did it start?      No.  8. OTHER SYMPTOMS: Do you have any other symptoms? (e.g., sore throat, cough, earache, difficulty breathing)     Sore throat, difficulty sleeping, chills, occasional cough, she states she feels pressure but it hasn't been toward the chest. Painful to swallow. Denies difficulty swallowing or breathing, drooling.  9. PREGNANCY: Is there any chance you are pregnant? When was your last menstrual period?     N/A.  She states it started with headaches and sinus/nasal congestion but she feels like it is moving down her throat and into her chest. She states she has been trying gargling warm salt water.  Protocols used: Sinus Pain or Congestion-A-AH

## 2023-11-01 NOTE — Progress Notes (Signed)
 Established patient visit   History of Present Illness   Discussed the use of AI scribe software for clinical note transcription with the patient, who gave verbal consent to proceed.  History of Present Illness   Elizabeth Pruitt is a 63 year old female who presents with worsening sinus congestion and associated symptoms.  Upper respiratory symptoms - Sinus congestion for over three weeks, progressively worsening - Congestion primarily in the upper head - Associated sore throat and headaches - Ear pressure and throat irritation - Chills present, no fever  Cough and mucus production - Persistent cough, worse at night - Thick mucus production, color varies from yellowish-green to dark brown - Sensation of a lump in the throat when swallowing - Mucus buildup causes snoring and nocturnal awakenings  Symptom management and medication tolerance - Uses Advil and Tylenol for symptom relief - Prefers azithromycin  for antibiotics as it is gentler on her stomach  Gastrointestinal symptoms - History of recent H. pylori infection treated with antibiotics, resulting in weight loss and gastrointestinal upset - Continues to experience intermittent diarrhea - Requests a longer supply of diarrhea medication - Uses kimchi as a probiotic alternative due to dairy intolerance       Physical Exam   Physical Exam Vitals reviewed.  Constitutional:      General: She is not in acute distress.    Appearance: Normal appearance. She is ill-appearing.  HENT:     Head: Normocephalic and atraumatic.     Right Ear: There is impacted cerumen.     Left Ear: There is impacted cerumen.     Nose:     Right Sinus: Maxillary sinus tenderness and frontal sinus tenderness present.     Left Sinus: Maxillary sinus tenderness and frontal sinus tenderness present.     Mouth/Throat:     Mouth: Mucous membranes are moist.     Pharynx: Uvula midline. Pharyngeal swelling and posterior oropharyngeal erythema  present.     Tonsils: No tonsillar exudate or tonsillar abscesses.  Eyes:     General: No scleral icterus.       Right eye: No discharge.        Left eye: No discharge.     Extraocular Movements: Extraocular movements intact.     Conjunctiva/sclera: Conjunctivae normal.     Pupils: Pupils are equal, round, and reactive to light.  Cardiovascular:     Rate and Rhythm: Normal rate and regular rhythm.     Pulses: Normal pulses.     Heart sounds: Normal heart sounds. No murmur heard.    No friction rub. No gallop.  Pulmonary:     Effort: Pulmonary effort is normal. No respiratory distress.     Breath sounds: Normal breath sounds. No wheezing.  Musculoskeletal:     Cervical back: Neck supple.  Lymphadenopathy:     Cervical: Cervical adenopathy present.  Skin:    General: Skin is warm and dry.  Neurological:     Mental Status: She is alert and oriented to person, place, and time.  Psychiatric:        Mood and Affect: Mood normal.        Behavior: Behavior normal.        Thought Content: Thought content normal.        Judgment: Judgment normal.    Assessment & Plan   Assessment and Plan    Acute bacterial sinusitis  Over three weeks of symptoms not improved with home care and conservative  measures indicating likely bacterial etiology. Azithromycin  chosen for efficacy and tolerance. - Prescribe azithromycin  (Zithromax ) for 5 days. - Recommend Florastor probiotic twice daily during antibiotic course and once daily after completion. - Ok to use Tylenol or Advil as needed. - Advise rest and small frequent meals.  Chronic diarrhea Intermittent diarrhea persists. Previous Lomotil  insufficient. Azithromycin  may aid in management. - Prescribe Lomotil  with increased quantity. - Recommend Florastor probiotic.        Follow up   Return if symptoms worsen or fail to improve. __________________________________ Zada FREDRIK Palin, DNP, APRN, FNP-BC Primary Care and Sports Medicine [facility]

## 2023-11-07 ENCOUNTER — Other Ambulatory Visit: Payer: Self-pay | Admitting: Urgent Care

## 2023-11-07 DIAGNOSIS — Z1231 Encounter for screening mammogram for malignant neoplasm of breast: Secondary | ICD-10-CM

## 2023-11-09 ENCOUNTER — Ambulatory Visit (HOSPITAL_BASED_OUTPATIENT_CLINIC_OR_DEPARTMENT_OTHER)
Admission: RE | Admit: 2023-11-09 | Discharge: 2023-11-09 | Disposition: A | Discharge status: Home / Self Care | Source: Ambulatory Visit | Attending: Urgent Care | Admitting: Urgent Care

## 2023-11-09 ENCOUNTER — Encounter (HOSPITAL_BASED_OUTPATIENT_CLINIC_OR_DEPARTMENT_OTHER): Payer: Self-pay

## 2023-11-09 DIAGNOSIS — Z1231 Encounter for screening mammogram for malignant neoplasm of breast: Secondary | ICD-10-CM | POA: Insufficient documentation

## 2023-11-13 ENCOUNTER — Ambulatory Visit: Payer: Self-pay | Admitting: Urgent Care

## 2023-12-01 ENCOUNTER — Other Ambulatory Visit: Payer: Self-pay | Admitting: Physician Assistant

## 2023-12-01 DIAGNOSIS — R198 Other specified symptoms and signs involving the digestive system and abdomen: Secondary | ICD-10-CM

## 2023-12-01 DIAGNOSIS — K219 Gastro-esophageal reflux disease without esophagitis: Secondary | ICD-10-CM

## 2023-12-01 DIAGNOSIS — R1013 Epigastric pain: Secondary | ICD-10-CM

## 2023-12-01 DIAGNOSIS — R110 Nausea: Secondary | ICD-10-CM

## 2023-12-14 ENCOUNTER — Encounter: Payer: Commercial Managed Care - PPO | Admitting: Family Medicine

## 2023-12-14 ENCOUNTER — Encounter: Admitting: Urgent Care

## 2024-01-03 ENCOUNTER — Other Ambulatory Visit: Payer: Self-pay | Admitting: Medical-Surgical

## 2024-01-03 DIAGNOSIS — R197 Diarrhea, unspecified: Secondary | ICD-10-CM

## 2024-02-27 ENCOUNTER — Other Ambulatory Visit: Payer: Self-pay | Admitting: Physician Assistant

## 2024-02-27 DIAGNOSIS — R198 Other specified symptoms and signs involving the digestive system and abdomen: Secondary | ICD-10-CM

## 2024-02-27 DIAGNOSIS — K219 Gastro-esophageal reflux disease without esophagitis: Secondary | ICD-10-CM

## 2024-02-27 DIAGNOSIS — R1013 Epigastric pain: Secondary | ICD-10-CM

## 2024-02-27 DIAGNOSIS — R110 Nausea: Secondary | ICD-10-CM
